# Patient Record
Sex: Female | Born: 1980
Health system: Southern US, Community
[De-identification: ages and names within clinical notes are randomized; demographics above are authoritative.]

## PROBLEM LIST (undated history)

## (undated) DIAGNOSIS — J309 Allergic rhinitis, unspecified: Secondary | ICD-10-CM

## (undated) DIAGNOSIS — O24419 Gestational diabetes mellitus in pregnancy, unspecified control: Secondary | ICD-10-CM

## (undated) DIAGNOSIS — D509 Iron deficiency anemia, unspecified: Secondary | ICD-10-CM

## (undated) DIAGNOSIS — T7840XA Allergy, unspecified, initial encounter: Secondary | ICD-10-CM

## (undated) DIAGNOSIS — F418 Other specified anxiety disorders: Secondary | ICD-10-CM

## (undated) DIAGNOSIS — Z803 Family history of malignant neoplasm of breast: Secondary | ICD-10-CM

## (undated) DIAGNOSIS — J45909 Unspecified asthma, uncomplicated: Secondary | ICD-10-CM

## (undated) DIAGNOSIS — Z800 Family history of malignant neoplasm of digestive organs: Secondary | ICD-10-CM

## (undated) HISTORY — DX: Unspecified asthma, uncomplicated: J45.909

## (undated) HISTORY — DX: Allergy, unspecified, initial encounter: T78.40XA

## (undated) HISTORY — DX: Other specified anxiety disorders: F41.8

## (undated) HISTORY — DX: Allergic rhinitis, unspecified: J30.9

## (undated) HISTORY — DX: Family history of malignant neoplasm of digestive organs: Z80.0

## (undated) HISTORY — DX: Iron deficiency anemia, unspecified: D50.9

## (undated) HISTORY — PX: TONSILLECTOMY: SUR1361

## (undated) HISTORY — DX: Family history of malignant neoplasm of breast: Z80.3

## (undated) HISTORY — DX: Gestational diabetes mellitus in pregnancy, unspecified control: O24.419

## (undated) HISTORY — PX: POLYPECTOMY: SHX149

---

## 2007-10-27 LAB — CONVERTED CEMR LAB

## 2008-01-06 ENCOUNTER — Ambulatory Visit: Payer: Self-pay | Admitting: Family Medicine

## 2008-01-06 DIAGNOSIS — J45909 Unspecified asthma, uncomplicated: Secondary | ICD-10-CM

## 2008-01-06 DIAGNOSIS — J309 Allergic rhinitis, unspecified: Secondary | ICD-10-CM | POA: Insufficient documentation

## 2008-01-28 ENCOUNTER — Ambulatory Visit: Payer: Self-pay | Admitting: Family Medicine

## 2008-02-04 LAB — CONVERTED CEMR LAB
ALT: 8 units/L (ref 0–35)
AST: 16 units/L (ref 0–37)
Alkaline Phosphatase: 36 units/L — ABNORMAL LOW (ref 39–117)
Bilirubin, Direct: 0.1 mg/dL (ref 0.0–0.3)
CO2: 27 meq/L (ref 19–32)
Glucose, Bld: 80 mg/dL (ref 70–99)
HDL: 61.7 mg/dL (ref >39.0)
Potassium: 4 meq/L (ref 3.5–5.1)
Sodium: 140 meq/L (ref 135–145)
Total Protein: 6.6 g/dL (ref 6.0–8.3)

## 2008-12-28 ENCOUNTER — Ambulatory Visit: Payer: Self-pay | Admitting: Family Medicine

## 2009-06-06 ENCOUNTER — Telehealth: Payer: Self-pay | Admitting: Family Medicine

## 2009-06-06 ENCOUNTER — Ambulatory Visit: Payer: Self-pay | Admitting: Family Medicine

## 2009-12-13 LAB — CONVERTED CEMR LAB
Pap Smear: NORMAL
Pap Smear: NORMAL

## 2010-01-11 ENCOUNTER — Ambulatory Visit: Payer: Self-pay | Admitting: Family Medicine

## 2010-01-11 ENCOUNTER — Encounter: Payer: Self-pay | Admitting: Family Medicine

## 2010-01-14 ENCOUNTER — Telehealth: Payer: Self-pay | Admitting: Family Medicine

## 2010-01-15 ENCOUNTER — Ambulatory Visit: Payer: Self-pay | Admitting: Family Medicine

## 2010-01-15 DIAGNOSIS — F418 Other specified anxiety disorders: Secondary | ICD-10-CM | POA: Insufficient documentation

## 2010-01-22 ENCOUNTER — Ambulatory Visit: Payer: Self-pay | Admitting: Psychology

## 2010-01-29 ENCOUNTER — Ambulatory Visit: Payer: Self-pay | Admitting: Psychology

## 2010-02-12 ENCOUNTER — Ambulatory Visit: Payer: Self-pay | Admitting: Psychology

## 2010-02-26 ENCOUNTER — Ambulatory Visit: Payer: Self-pay | Admitting: Psychology

## 2010-03-19 ENCOUNTER — Ambulatory Visit: Payer: Self-pay | Admitting: Psychology

## 2010-04-02 ENCOUNTER — Ambulatory Visit: Payer: Self-pay | Admitting: Psychology

## 2010-04-09 ENCOUNTER — Ambulatory Visit: Payer: Self-pay | Admitting: Psychology

## 2010-04-12 ENCOUNTER — Telehealth (INDEPENDENT_AMBULATORY_CARE_PROVIDER_SITE_OTHER): Payer: Self-pay | Admitting: *Deleted

## 2010-04-16 ENCOUNTER — Ambulatory Visit
Admission: RE | Admit: 2010-04-16 | Discharge: 2010-04-16 | Payer: Self-pay | Source: Home / Self Care | Attending: Family Medicine | Admitting: Family Medicine

## 2010-04-16 LAB — CONVERTED CEMR LAB
Albumin: 3.8 g/dL (ref 3.5–5.2)
CO2: 27 meq/L (ref 19–32)
Calcium: 9.2 mg/dL (ref 8.4–10.5)
Creatinine, Ser: 0.6 mg/dL (ref 0.4–1.2)
Glucose, Bld: 77 mg/dL (ref 70–99)
HDL: 71.8 mg/dL (ref >39.00)
Total Protein: 6.7 g/dL (ref 6.0–8.3)
Triglycerides: 47 mg/dL (ref 0.0–149.0)

## 2010-04-23 ENCOUNTER — Ambulatory Visit
Admission: RE | Admit: 2010-04-23 | Discharge: 2010-04-23 | Payer: Self-pay | Source: Home / Self Care | Attending: Family Medicine | Admitting: Family Medicine

## 2010-05-14 NOTE — Progress Notes (Signed)
----   Converted from flag ---- ---- 06/06/2009 4:01 PM, Lowella Petties CMA wrote: Called report from [facility]-  right leg negative for DVT, pt will go home. ------------------------------  [place] neg

## 2010-05-14 NOTE — Assessment & Plan Note (Signed)
Summary: 2:15  LEG CRAMPING X 2 DAYS/CLE   Vital Signs:  Patient profile:   30 year old female Height:      63 inches Weight:      127.13 pounds BMI:     22.60 Temp:     98.2 degrees F oral Pulse rate:   72 / minute Pulse rhythm:   regular BP sitting:   122 / 72  (left arm) Cuff size:   regular  Vitals Entered By: Linde Gillis CMA Duncan Dull) (June 06, 2009 2:08 PM) CC: cramp in right calf   History of Present Illness: 30 year old female:  2 days ago, cramping R calf. Pain in the mid portion of calf no inciting event  very active female on OCP relatively stationary at work  no FH of clotting disorder, DVT, PE  Allergies (verified): No Known Drug Allergies  Past History:  Past medical, surgical, family and social histories (including risk factors) reviewed, and no changes noted (except as noted below).  Past Medical History: Reviewed history from 01/06/2008 and no changes required. Current Problems:  ASTHMA, INTERMITTENT, MILD (ICD-493.90) ALLERGIC RHINITIS (ICD-477.9)    Past Surgical History: Reviewed history from 01/06/2008 and no changes required. Tonsillectomy  Family History: Reviewed history from 01/06/2008 and no changes required. father: healthy mother: breast cancer, age 6, no genetic testing done. sister: healthy MGM: colon cancer PGF: HTN, CAD  Social History: Reviewed history from 01/06/2008 and no changes required. 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 GI: No n/v/d Pulm: No SOB, cough, wheezing Interactive and getting along well at home.  Otherwise, ROS is as per the HPI.   Physical Exam  General:  Well-developed,well-nourished,in no acute distress; alert,appropriate and  cooperative throughout examination Head:  Normocephalic and atraumatic without obvious abnormalities. No apparent alopecia or balding. Ears:  no external deformities.   Nose:  no external deformity.   Lungs:  Normal respiratory effort, chest expands symmetrically. Lungs are clear to auscultation, no crackles or wheezes. Extremities:  no edema TTP along posterior of calf, mid-portion, tender with squeeze Neurologic:  alert & oriented X3 and gait normal.   Psych:  Cognition and judgment appear intact. Alert and cooperative with normal attention span and concentration. No apparent delusions, illusions, hallucinations   Impression & Recommendations:  Problem # 1:  CALF PAIN, RIGHT (ICD-729.5) Assessment New check u/s - DVT  if normal, expectant management and prob muscle  Orders: Radiology Referral (Radiology)  Complete Medication List: 1)  Trinessa (28) 0.035 Mg Tabs (Norgestimate-ethinyl estradiol) .... Take one by mouth daily as directed 2)  Proventil Hfa 108 (90 Base) Mcg/act Aers (Albuterol sulfate) .... 2 sprays per nostril q4 hours as needed wheeze 3)  Clonazepam 0.5 Mg Tbdp (Clonazepam) .... 1/2 to 1 tab by mouth every 6 hours as needed anxiety 4)  Epipen 0.3 Mg/0.29ml Devi (Epinephrine) .... Use as directed  Patient Instructions: 1)  Referral Appointment Information 2)  Day/Date: 3)  Time: 4)  Place/MD: 5)  Address: 6)  Phone/Fax: 7)  Patient given appointment information. Information/Orders faxed/mailed.   Current Allergies (reviewed today): No known allergies

## 2010-05-14 NOTE — Assessment & Plan Note (Signed)
Summary: ? ANXIETY   Vital Signs:  Patient profile:   30 year old female Height:      63 inches Weight:      120.0 pounds BMI:     21.33 Temp:     99.4 degrees F oral Pulse rate:   72 / minute Pulse rhythm:   regular BP sitting:   120 / 70  (left arm) Cuff size:   regular  Vitals Entered By: Benny Lennert CMA Duncan Dull) (January 15, 2010 1:57 PM)  History of Present Illness: Chief complaint Anxiety  In last year..she has been increasingly stress. In past few days..she feels like she cannot control it anymore. A lot of home problems..she is scared about the furture...cannot see the future. Anxious all day. "cannot put on happy face any more" Cannot sleep at night. No SI..but does think if she wasn't here she wouldn't have problems. Some anhedonia.Marland Kitchendoes not have time for fun thing. Has 2 jobs currently. Husband is her main and only support..she does not want to tell friends and family.  Mother died two years ago..money problems.Taken on extra work, legal issues in past.  Fatigued. Some clouded thinking.   No  personal history of depression, anxiety. Family history of agoraphobia   Problems Prior to Update: 1)  Calf Pain, Right  (ICD-729.5) 2)  Anxiety, Situational  (ICD-308.3) 3)  Seborrheic Keratosis  (ICD-702.19) 4)  Asthma, Intermittent, Mild  (ICD-493.90) 5)  Allergic Rhinitis  (ICD-477.9)  Current Medications (verified): 1)  Trinessa (28) 0.035 Mg Tabs (Norgestimate-Ethinyl Estradiol) .... Take One By Mouth Daily As Directed 2)  Proventil Hfa 108 (90 Base) Mcg/act Aers (Albuterol Sulfate) .... 2 Sprays Per Nostril Q4 Hours As Needed Wheeze 3)  Clonazepam 0.5 Mg Tbdp (Clonazepam) .... 1/2 To 1 Tab By Mouth Every 6 Hours As Needed Anxiety 4)  Epipen 0.3 Mg/0.47ml Devi (Epinephrine) .... Use As Directed 5)  Fluticasone Propionate 50 Mcg/act Susp (Fluticasone Propionate) .... 2 Sprays Per Nostril Daily  Allergies (verified): No Known Drug Allergies  Past  History:  Past medical, surgical, family and social histories (including risk factors) reviewed, and no changes noted (except as noted below).  Past Medical History: Reviewed history from 01/06/2008 and no changes required. Current Problems:  ASTHMA, INTERMITTENT, MILD (ICD-493.90) ALLERGIC RHINITIS (ICD-477.9)    Past Surgical History: Reviewed history from 01/06/2008 and no changes required. Tonsillectomy  Family History: Reviewed history from 01/06/2008 and no changes required. father: healthy mother: breast cancer, age 67, no genetic testing done. sister: healthy MGM: colon cancer PGF: HTN, CAD  Social History: Reviewed history from 01/06/2008 and no changes required. 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 CV:  Denies chest pain or discomfort. Resp:  Denies shortness of breath. GI:  Denies abdominal pain. GU:  Denies dysuria.  Physical Exam  General:  tearful, anxious, breathing quickly..calms down through appt to more relaxed state. Mouth:  MMM Neck:  no carotid bruit or thyromegaly no cervical or supraclavicular lymphadenopathy  Lungs:  Normal respiratory effort, chest expands symmetrically. Lungs are clear to auscultation, no crackles or wheezes. Heart:  Normal rate and regular rhythm. S1 and S2 normal without gallop, murmur, click, rub or other extra sounds. Psych:  Oriented X3, memory intact for recent and remote, good eye contact, subdued, withdrawn, poor eye contact, tearful, moderately anxious, and poor  concentration.   occ laughs inappropriately/hysterically   Impression & Recommendations:  Problem # 1:  DEPRESSION/ANXIETY (ICD-300.4) Multiple life stressors..she is coping poorly..has pushed down mood and anxiety for longtime..now "bursting at seams" Refer  to pshycologist.  Start on seratonin 50 mg daily .Marland Kitchenuse clonazepam temporarily until SSRI begins working. Counsled on med use, SE.  Gave info on [facility] Help line.  Total visit time > 50% spent counseling and cordinating patients care  Orders: Psychology Referral (Psychology)  Complete Medication List: 1)  Trinessa (28) 0.035 Mg Tabs (Norgestimate-ethinyl estradiol) .... Take one by mouth daily as directed 2)  Proventil Hfa 108 (90 Base) Mcg/act Aers (Albuterol sulfate) .... 2 sprays per nostril q4 hours as needed wheeze 3)  Epipen 0.3 Mg/0.79ml Devi (Epinephrine) .... Use as directed 4)  Fluticasone Propionate 50 Mcg/act Susp (Fluticasone propionate) .... 2 sprays per nostril daily 5)  Sertraline Hcl 50 Mg Tabs (Sertraline hcl) .Marland Kitchen.. 1 tab by mouth at bedtime 6)  Clonazepam 0.5 Mg Tabs (Clonazepam) .... 1/2 to 1 tab by mouth daily as needed anxiety  Patient Instructions: 1)  Referral Appointment Information 2)  Day/Date: 3)  Time: 4)  Place/MD: 5)  Address: 6)  Phone/Fax: 7)  Patient given appointment information. Information/Orders faxed/mailed.  8)  Start sertraline at bedtime. 9)  Use clonazepam intermittantly for anxiety. 10)  Call if any SE to medicaiton.  11)  Please schedule a follow-up appointment in 1 month.  Prescriptions: CLONAZEPAM 0.5 MG TABS (CLONAZEPAM) 1/2 to 1 tab by mouth daily as needed anxiety  #20 x 0   Entered and Authorized by:   Kerby Nora MD   Signed by:   Kerby Nora MD on 01/15/2010   Method used:   Print then Give to Patient   RxID:   [id number] SERTRALINE HCL 50 MG TABS (SERTRALINE HCL) 1 tab by mouth at bedtime  #30 x 5   Entered and Authorized by:   Kerby Nora MD   Signed by:   Kerby Nora MD on 01/15/2010   Method used:   [facility]        [facility]  [address] [facility]retail[facility]       [address]       [place], [place]  [address]       Ph: [phone] or [phone]       Fax: [phone]   RxID:    [id number]   Current Allergies (reviewed today): No known allergies

## 2010-05-14 NOTE — Progress Notes (Signed)
Summary: call a nurse   Phone Note Call from Patient   Summary of Call: Triage Record Num: [id number] Operator: Hillary Bow Patient Name: Kayla Solis Call Date & Time: 01/13/2010 12:26:27PM Patient Phone: [phone] PCP: Kerby Nora Patient Gender: Female PCP Fax : [phone] Patient DOB: 03-20-1981 Practice Name[facility] Reason for Call: Spouse/Paul calling about Pt having severe anxiety d/t stress at home. Pt is crying all day per husband, can't function as normal. Pt had Clonazepam PRN for anxiety is currently out and would like to have refilled so Pt can work on 01-14-10. All emergent sxs r/o per Anxiety Protocol. Clonazepam 0.5mg  PO 1/2 tablet daily PRN Anxiety, 5 tablets,no refills, called into [facility], [place], [phone], until Pt can f/u w/ office on 01-14-10 for appt. Husband verbalized understanding. Home advice given. Protocol(s) Used: Anxiety: Panic Recommended Outcome per Protocol: Call Charleigh Correnti within 24 Hours Reason for Outcome: History of psychiatric or emotional disorder or hospitalization for same Care Advice:  ~ IMMEDIATE ACTION  ~ CAUTIONS 10/02/ Initial call taken by: Melody Comas,  January 14, 2010 8:39 AM  Follow-up for Phone Call        please call patient to check on her and set up f/u with AEB Hannah Beat MD  January 14, 2010 5:25 PM   Additional Follow-up for Phone Call Additional follow up Details #1::        Patient has appt today.Consuello Masse CMA   Additional Follow-up by: Benny Lennert CMA Duncan Dull),  January 15, 2010 8:12 AM

## 2010-05-14 NOTE — Assessment & Plan Note (Signed)
Summary: REFILL MEDS/CLE   Vital Signs:  Patient profile:   30 year old female Height:      63 inches Weight:      122.0 pounds BMI:     21.69 Temp:     99.3 degrees F oral Pulse rate:   72 / minute Pulse rhythm:   regular BP sitting:   120 / 70  (left arm) Cuff size:   regular  Vitals Entered By: Benny Lennert CMA Duncan Dull) (January 11, 2010 3:53 PM)  History of Present Illness: Chief complaint Refill medication  Sees GYN for yearly CPX.   Asthma symptoms are much worse during allergy season as in fall currently.  Post nasal drip, congestion...uses saline spray.  Not on any allergy med...has tried Careers adviser, zyrtec, claritin D. Tried singulair, advair..no relief. Has allergy shots.  Asthma History    Initial Asthma Severity Rating:    Age range: 12+ years    Symptoms: >2 days/week; not daily during fall allergiy season    Nighttime Awakenings: >1/week but not nightly    Interferes w/ normal activity: no limitations    SABA use (not for EIB): 0-2 days/week    Asthma Severity Assessment: Moderate Persistent   Problems Prior to Update: 1)  Calf Pain, Right  (ICD-729.5) 2)  Anxiety, Situational  (ICD-308.3) 3)  Seborrheic Keratosis  (ICD-702.19) 4)  Asthma, Intermittent, Mild  (ICD-493.90) 5)  Allergic Rhinitis  (ICD-477.9)  Current Medications (verified): 1)  Trinessa (28) 0.035 Mg Tabs (Norgestimate-Ethinyl Estradiol) .... Take One By Mouth Daily As Directed 2)  Proventil Hfa 108 (90 Base) Mcg/act Aers (Albuterol Sulfate) .... 2 Sprays Per Nostril Q4 Hours As Needed Wheeze 3)  Clonazepam 0.5 Mg Tbdp (Clonazepam) .... 1/2 To 1 Tab By Mouth Every 6 Hours As Needed Anxiety 4)  Epipen 0.3 Mg/0.78ml Devi (Epinephrine) .... Use As Directed  Allergies (verified): No Known Drug Allergies  Past History:  Past medical, surgical, family and social histories (including risk factors) reviewed, and no changes noted (except as noted below).  Past Medical History: Reviewed  history from 01/06/2008 and no changes required. Current Problems:  ASTHMA, INTERMITTENT, MILD (ICD-493.90) ALLERGIC RHINITIS (ICD-477.9)    Past Surgical History: Reviewed history from 01/06/2008 and no changes required. Tonsillectomy  Family History: Reviewed history from 01/06/2008 and no changes required. father: healthy mother: breast cancer, age 49, no genetic testing done. sister: healthy MGM: colon cancer PGF: HTN, CAD  Social History: Reviewed history from 01/06/2008 and no changes required. 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 CV:  Denies chest pain or discomfort. Resp:  Denies shortness of breath. GI:  Denies abdominal pain. GU:  Denies dysuria.  Physical Exam  General:  Well-developed,well-nourished,in no acute distress; alert,appropriate and cooperative throughout examination Eyes:  No corneal or conjunctival inflammation noted. EOMI. Perrla. Funduscopic exam benign, without hemorrhages, exudates or papilledema. Vision grossly normal. Ears:  External ear exam shows no significant lesions or deformities.  Otoscopic examination reveals clear canals, tympanic membranes are intact bilaterally without bulging, retraction, inflammation or discharge. Hearing is grossly normal bilaterally. Nose:  no external deformity.  nasal dischargemucosal pallor.   Mouth:  Oral mucosa and oropharynx without lesions or exudates.  Teeth in good repair. Neck:  no carotid bruit or thyromegaly no cervical or supraclavicular lymphadenopathy  Lungs:  Normal respiratory effort, chest expands  symmetrically. Lungs are clear to auscultation, no crackles or wheezes. Heart:  Normal rate and regular rhythm. S1 and S2 normal without gallop, murmur, click, rub or other extra sounds. Abdomen:  Bowel  sounds positive,abdomen soft and non-tender without masses, organomegaly or hernias noted. Pulses:  R and L posterior tibial pulses are full and equal bilaterally  Extremities:  no edema    Impression & Recommendations:  Problem # 1:  ASTHMA, INTERMITTENT, MILD (ICD-493.90) Spirometry in nml range.  no additional meds for asthma indicated. Continue to use albuterol sparingly.  Her updated medication list for this problem includes:    Proventil Hfa 108 (90 Base) Mcg/act Aers (Albuterol sulfate) .Marland Kitchen... 2 sprays per nostril q4 hours as needed wheeze  Problem # 2:  ALLERGIC RHINITIS (ICD-477.9) Needs improved control.Marland Kitchenadd fluticasone nasal spray. Oral antihistminaines have not been helpful in past.  Her updated medication list for this problem includes:    Fluticasone Propionate 50 Mcg/act Susp (Fluticasone propionate) .Marland Kitchen... 2 sprays per nostril daily  Complete Medication List: 1)  Trinessa (28) 0.035 Mg Tabs (Norgestimate-ethinyl estradiol) .... Take one by mouth daily as directed 2)  Proventil Hfa 108 (90 Base) Mcg/act Aers (Albuterol sulfate) .... 2 sprays per nostril q4 hours as needed wheeze 3)  Epipen 0.3 Mg/0.59ml Devi (Epinephrine) .... Use as directed 4)  Fluticasone Propionate 50 Mcg/act Susp (Fluticasone propionate) .... 2 sprays per nostril daily  Patient Instructions: 1)   Start nasal fluticasone 2 sprays per nostril daily.  2)  Call if allergy symptoms worsening.  3)  Please schedule a follow-up appointment in 1 year.  Prescriptions: FLUTICASONE PROPIONATE 50 MCG/ACT SUSP (FLUTICASONE PROPIONATE) 2 sprays per nostril daily  #1 x 0   Entered and Authorized by:   Kerby Nora MD   Signed by:   Kerby Nora MD on 01/11/2010   Method used:   [facility] [facility]       [address]       [place], [place]  [address]       Ph: [phone] or [phone]       Fax: [phone]   RxID:   [id number] EPIPEN 0.3 MG/0.3ML DEVI  (EPINEPHRINE) Use as directed  #1 x 2   Entered and Authorized by:   Kerby Nora MD   Signed by:   Kerby Nora MD on 01/11/2010   Method used:   [facility]        [id number]       [address]       [place], [place]  [address]       Ph: [phone] or [phone]       Fax: [phone]   RxID:   [id number] PROVENTIL HFA 108 (90 BASE) MCG/ACT AERS (ALBUTEROL SULFATE) 2 sprays per nostril q4 hours as needed wheeze  #1 x 3   Entered and Authorized by:   Kerby Nora MD   Signed by:   Kerby Nora MD on 01/11/2010   Method used:   [facility]        [facility] [facility]       [address]       [place], [place]  [address]       Ph: [phone] or [phone]       Fax: [phone]   RxID:   [id number]   Current Allergies (reviewed today): No known allergies   Last Flu Vaccine:  Fluvax  3+ (01/06/2008 1:42:38 PM) Flu Vaccine Next Due:  Refused Last PAP:  normal (11/12/2008 8:34:56 AM) PAP Result Date:  12/13/2009 PAP Result:  normal PAP Next Due:  1 yr

## 2010-05-16 NOTE — Progress Notes (Signed)
----   Converted from flag ---- ---- 04/11/2010 10:21 PM, Kerby Nora MD wrote: CMET, lipids Dx v77.91 and STD panel if pt interested  ---- 04/10/2010 9:57 AM, Liane Comber CMA (AAMA) wrote: Lab orders please! Good Morning! This pt is scheduled for cpx labs Monday, which labs to draw and dx codes to use? Thanks Tasha ------------------------------

## 2010-05-16 NOTE — Assessment & Plan Note (Signed)
Summary: CPX/CLE   Vital Signs:  Patient profile:   30 year old female Height:      63 inches Weight:      127 pounds BMI:     22.58 Temp:     98.8 degrees F oral Pulse rate:   72 / minute Pulse rhythm:   regular BP sitting:   110 / 70  (left arm) Cuff size:   regular  Vitals Entered By: Benny Lennert CMA Duncan Dull) (April 23, 2010 2:44 PM)  History of Present Illness: Chief complaint follow up  Sees GYN for yearly pelvic and pap smear.   Depression.. improved control... since last OV. Seeing Dr. Laymond Purser pschycologist weekly.  On sertraline 50 mg daily. No SI, no HI.   Interested in getting pregnant. On folic acid.   Asthma, mild intermittant.. no problems in past few years.  Allergies well controlled in last year.   Reviewed labs in detail.   Problems Prior to Update: 1)  Screening For Lipoid Disorders  (ICD-V77.91) 2)  Depression/anxiety  (ICD-300.4) 3)  Asthma, Intermittent, Mild  (ICD-493.90) 4)  Allergic Rhinitis  (ICD-477.9)  Current Medications (verified): 1)  Proventil Hfa 108 (90 Base) Mcg/act Aers (Albuterol Sulfate) .... 2 Sprays Per Nostril Q4 Hours As Needed Wheeze 2)  Epipen 0.3 Mg/0.1ml Devi (Epinephrine) .... Use As Directed 3)  Fluticasone Propionate 50 Mcg/act Susp (Fluticasone Propionate) .... 2 Sprays Per Nostril Daily 4)  Sertraline Hcl 50 Mg Tabs (Sertraline Hcl) .Marland Kitchen.. 1 Tab By Mouth At Bedtime 5)  Clonazepam 0.5 Mg Tabs (Clonazepam) .... 1/2 To 1 Tab By Mouth Daily As Needed Anxiety  Allergies (verified): No Known Drug Allergies  Past History:  Past medical, surgical, family and social histories (including risk factors) reviewed, and no changes noted (except as noted below).  Past Medical History: Reviewed history from 01/06/2008 and no changes required. Current Problems:  ASTHMA, INTERMITTENT, MILD (ICD-493.90) ALLERGIC RHINITIS (ICD-477.9)    Past Surgical History: Reviewed history from 01/06/2008 and no changes  required. Tonsillectomy  Family History: Reviewed history from 01/06/2008 and no changes required. father: healthy mother: breast cancer, age 72, no genetic testing done. sister: healthy MGM: colon cancer PGF: HTN, CAD  Social History: Reviewed history from 01/06/2008 and no changes required. 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 CV:  Denies chest pain or discomfort. Resp:  Denies shortness of breath. GI:  Denies abdominal pain and bloody stools. GU:  Denies abnormal vaginal bleeding and dysuria.  Physical Exam  General:  Well-developed,well-nourished,in no acute distress; alert,appropriate and cooperative throughout examination Eyes:  No corneal or conjunctival inflammation noted. EOMI. Perrla. Funduscopic exam benign, without hemorrhages, exudates or papilledema. Vision grossly normal. Ears:  External ear exam shows no significant lesions or deformities.  Otoscopic examination reveals clear canals, tympanic membranes are intact bilaterally without bulging, retraction, inflammation or discharge. Hearing is grossly normal bilaterally. Nose:  External nasal examination shows no deformity or inflammation. Nasal mucosa are pink and moist without lesions or exudates. Mouth:  Oral mucosa and oropharynx without lesions or exudates.  Teeth in good repair. Neck:  no carotid bruit or thyromegaly no cervical or supraclavicular lymphadenopathy  Lungs:  Normal respiratory effort, chest expands symmetrically. Lungs are clear to auscultation, no crackles or wheezes. Heart:  Normal rate and regular rhythm. S1 and S2 normal without gallop, murmur,  click, rub or other extra sounds. Abdomen:  Bowel sounds positive,abdomen soft and non-tender without masses, organomegaly or hernias noted. Genitalia:   per GYN  Msk:  No deformity or scoliosis noted of thoracic or lumbar spine.   Pulses:  R and L posterior tibial pulses are full and equal bilaterally  Extremities:  no edema Skin:  Intact without suspicious lesions or rashes Psych:  Cognition and judgment appear intact. Alert and cooperative with normal attention span and concentration. No apparent delusions, illusions, hallucinations   Impression & Recommendations:  Problem # 1:  DEPRESSION/ANXIETY (ICD-300.4) Assessment Improved Wean off sertraline as directed. Continue counsleing.  Problem # 2:  ALLERGIC RHINITIS (ICD-477.9) Assessment: Improved Well controlled off medicaiton.  The following medications were removed from the medication list:    Fluticasone Propionate 50 Mcg/act Susp (Fluticasone propionate) .Marland Kitchen... 2 sprays per nostril daily  Problem # 3:  ASTHMA, INTERMITTENT, MILD (ICD-493.90) Assessment: Unchanged Minimal problem.. no need for controller med.  Her updated medication list for this problem includes:    Proventil Hfa 108 (90 Base) Mcg/act Aers (Albuterol sulfate) .Marland Kitchen... 2 sprays per nostril q4 hours as needed wheeze  Complete Medication List: 1)  Proventil Hfa 108 (90 Base) Mcg/act Aers (Albuterol sulfate) .... 2 sprays per nostril q4 hours as needed wheeze 2)  Epipen 0.3 Mg/0.37ml Devi (Epinephrine) .... Use as directed 3)  Sertraline Hcl 50 Mg Tabs (Sertraline hcl) .... 1/2 tab by mouth at bedtime, wean.  Patient Instructions: 1)  Start by weaning off sertraline...go down to 25 mg daily x 2 weeks, tehn every other day for 1 week then every 2 days x 1 week then off.  2)  Continue counsleing.  3)  Please schedule a follow-up appointment in 1 year.    Orders Added: 1)  Est. Patient Level IV [28413]    Current Allergies (reviewed today): No known allergies   Last Flu Vaccine:  Fluvax 3+ (01/06/2008 1:42:38 PM) Flu Vaccine Next Due:  Refused

## 2010-06-26 ENCOUNTER — Other Ambulatory Visit (HOSPITAL_COMMUNITY): Payer: Self-pay | Admitting: Obstetrics and Gynecology

## 2010-06-26 DIAGNOSIS — O3680X0 Pregnancy with inconclusive fetal viability, not applicable or unspecified: Secondary | ICD-10-CM

## 2010-06-27 ENCOUNTER — Encounter (HOSPITAL_COMMUNITY): Payer: Self-pay

## 2010-06-27 ENCOUNTER — Ambulatory Visit (HOSPITAL_COMMUNITY)
Admission: RE | Admit: 2010-06-27 | Discharge: 2010-06-27 | Disposition: A | Discharge status: Home / Self Care | Payer: 59 | Source: Ambulatory Visit | Attending: Obstetrics and Gynecology | Admitting: Obstetrics and Gynecology

## 2010-06-27 ENCOUNTER — Other Ambulatory Visit (HOSPITAL_COMMUNITY): Payer: Self-pay | Admitting: Obstetrics and Gynecology

## 2010-06-27 DIAGNOSIS — Z3689 Encounter for other specified antenatal screening: Secondary | ICD-10-CM | POA: Insufficient documentation

## 2010-06-27 DIAGNOSIS — O3680X0 Pregnancy with inconclusive fetal viability, not applicable or unspecified: Secondary | ICD-10-CM

## 2010-06-27 DIAGNOSIS — O368390 Maternal care for abnormalities of the fetal heart rate or rhythm, unspecified trimester, not applicable or unspecified: Secondary | ICD-10-CM | POA: Insufficient documentation

## 2010-07-04 ENCOUNTER — Ambulatory Visit (HOSPITAL_COMMUNITY)
Admission: RE | Admit: 2010-07-04 | Discharge: 2010-07-04 | Disposition: A | Discharge status: Home / Self Care | Payer: 59 | Source: Ambulatory Visit | Attending: Obstetrics and Gynecology | Admitting: Obstetrics and Gynecology

## 2010-07-04 DIAGNOSIS — O368390 Maternal care for abnormalities of the fetal heart rate or rhythm, unspecified trimester, not applicable or unspecified: Secondary | ICD-10-CM | POA: Insufficient documentation

## 2010-07-04 DIAGNOSIS — Z3689 Encounter for other specified antenatal screening: Secondary | ICD-10-CM | POA: Insufficient documentation

## 2010-07-04 DIAGNOSIS — O3680X0 Pregnancy with inconclusive fetal viability, not applicable or unspecified: Secondary | ICD-10-CM

## 2010-07-09 ENCOUNTER — Ambulatory Visit (HOSPITAL_COMMUNITY)
Admission: RE | Admit: 2010-07-09 | Discharge: 2010-07-09 | Disposition: A | Discharge status: Home / Self Care | Payer: 59 | Source: Ambulatory Visit | Attending: Obstetrics and Gynecology | Admitting: Obstetrics and Gynecology

## 2010-07-09 ENCOUNTER — Other Ambulatory Visit: Payer: Self-pay | Admitting: Obstetrics and Gynecology

## 2010-07-09 DIAGNOSIS — O021 Missed abortion: Secondary | ICD-10-CM | POA: Insufficient documentation

## 2010-07-09 LAB — CBC
HCT: 38.5 % (ref 36.0–46.0)
MCH: 25.5 pg — ABNORMAL LOW (ref 26.0–34.0)
MCV: 80.5 fL (ref 78.0–100.0)
Platelets: 225 10*3/uL (ref 150–400)
RBC: 4.78 MIL/uL (ref 3.87–5.11)

## 2010-07-09 LAB — DIFFERENTIAL
Eosinophils Absolute: 0.2 10*3/uL (ref 0.0–0.7)
Eosinophils Relative: 3 % (ref 0–5)
Lymphs Abs: 2 10*3/uL (ref 0.7–4.0)
Monocytes Relative: 14 % — ABNORMAL HIGH (ref 3–12)
Neutrophils Relative %: 57 % (ref 43–77)

## 2010-07-14 DEATH — deceased

## 2010-07-18 NOTE — Op Note (Signed)
  Kayla Solis, Kayla Solis                 ACCOUNT NO.:  [id number]  MEDICAL RECORD NO.:  [id number]           PATIENT TYPE:  O  LOCATION:  [facility]                          [facility]  PHYSICIAN:  Malachi Pro. Ambrose Mantle, M.D. DATE OF BIRTH:  17-May-1980  DATE OF PROCEDURE:  07/09/2010 DATE OF DISCHARGE:                              OPERATIVE REPORT   PREOPERATIVE DIAGNOSIS:  Nonviable intrauterine pregnancy.  Fetal pole was never truly seen well.  POSTOPERATIVE DIAGNOSIS:  Nonviable intrauterine pregnancy.  Fetal pole was never truly seen well.  OPERATIONS:  Suction dilation and curettage.  OPERATOR:  Malachi Pro. Ambrose Mantle, MD  ANESTHESIA:  MAC anesthesia.  The patient was brought to the operating room and placed under MAC anesthesia.  The vulva, vagina, and perineum were prepped with Betadine solution.  The urethra was prepped.  The bladder was emptied with a [place] catheter.  The area was draped as a sterile field.  A weighted speculum was placed posteriorly.  Cervix was grasped on its anterior lip with a tenaculum.  The uterus was difficult to feel.  It was thought to be about 6 weeks size.  The adnexa were free of masses.  Cervix was drawn into the operative field, sounded to 10 cm, slightly anteriorly. It was dilated up so that it would admit a #7 curved suction curette. Initially, I did not get any tissue.  I had to use the polyp forceps to see if there was tissue and I did find some old appearing yellowish tissue, so I was confident that the patient had not passed the tissue and then further suctions revealed a significant amount of tissue that was compatible with the early pregnancy.  A sharp D and C was done to see if the walls felt smooth, they felt smooth.  I did a final suction. There was no more tissue present and the procedure was terminated. There was no bleeding from the tenaculum site.  The patient was returned to recovery in satisfactory condition.  Blood loss was thought to  be no more than 25 mL.     Malachi Pro. Ambrose Mantle, M.D.     TFH/MEDQ  D:  07/09/2010  T:  08-07-10  Job:  [id number]  Electronically Signed by Tracey Harries M.D. on 07/18/2010 08:52:26 AM

## 2010-09-15 IMAGING — US US EXTREM LOW VENOUS*R*
1 series · 17 of 24 positions shown · non-contrast
Comparison: none

REASON FOR EXAM: call report  [id number]  right leg swelling  calf pain
eval for DVT
COMMENTS:

[Series 1: us extrem low venous*right* · 17 of 25 slices shown]
[im 1/25]
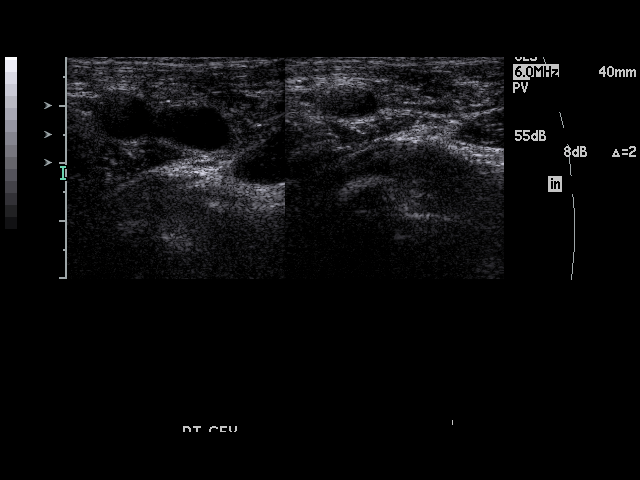
[im 3/25]
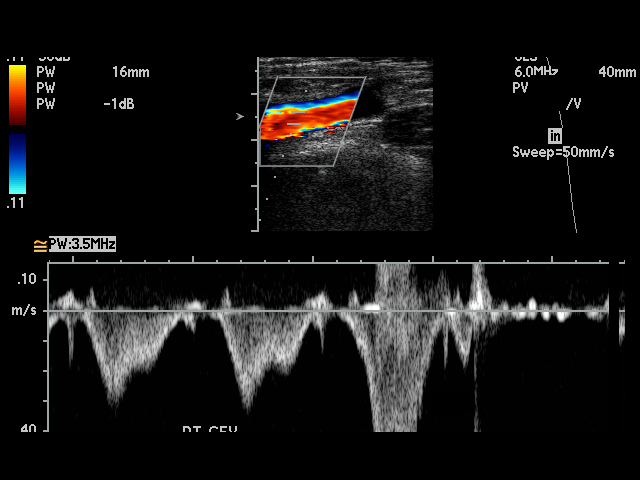
[im 4/25]
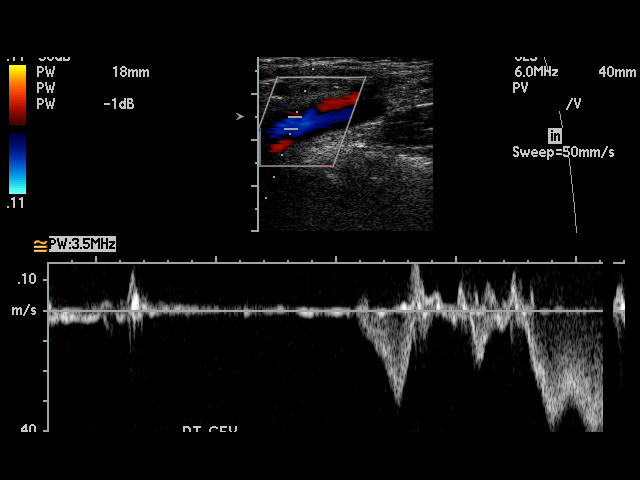
[im 5/25]
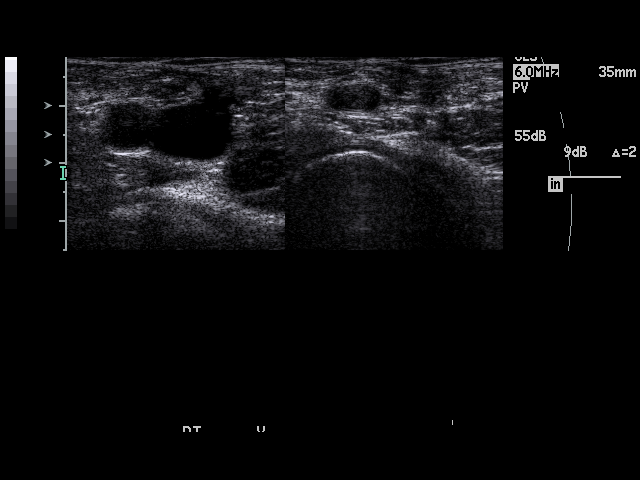
[im 7/25]
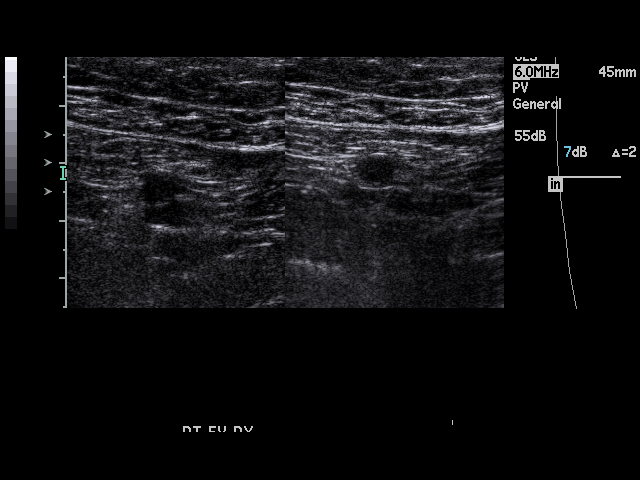
[im 8/25]
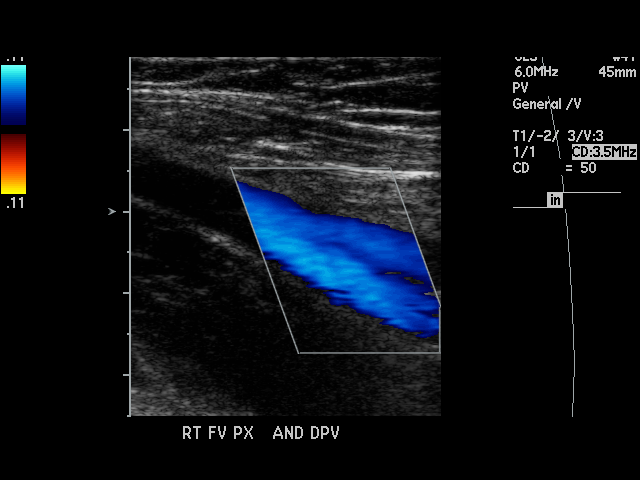
[im 10/25]
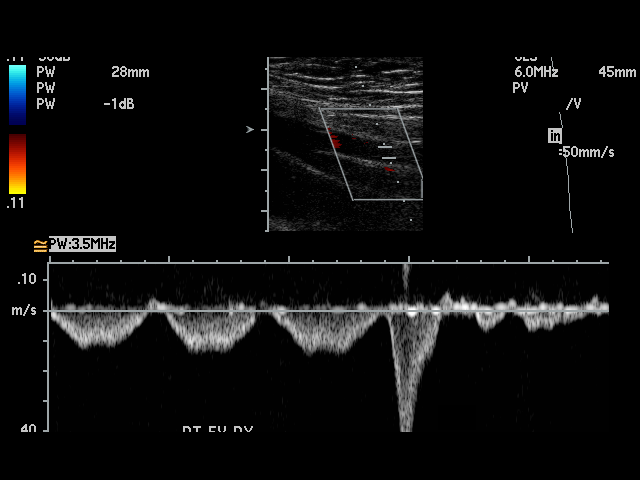
[im 11/25]
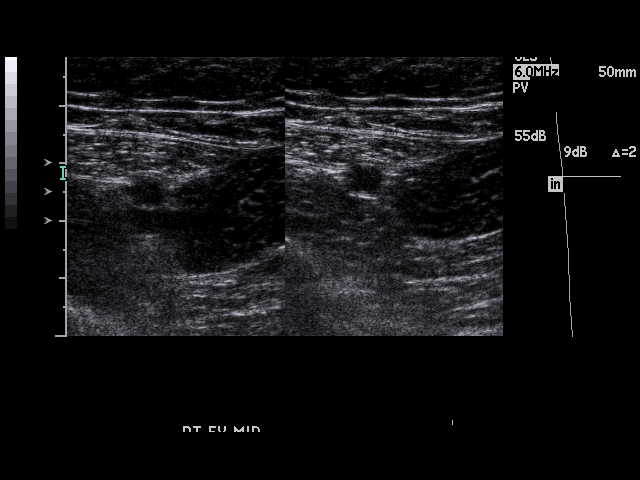
[im 13/25]
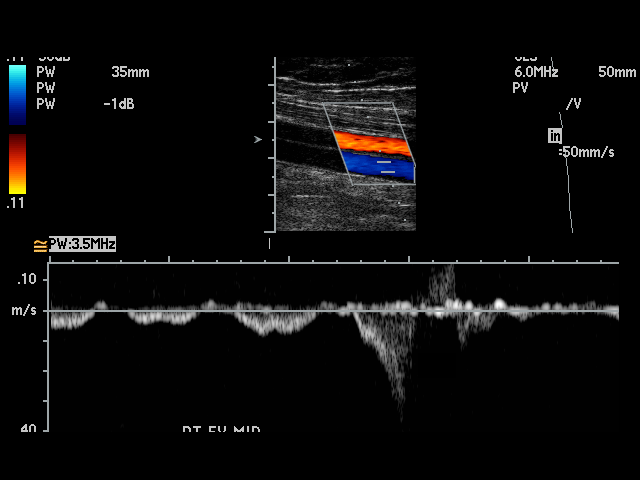
[im 14/25]
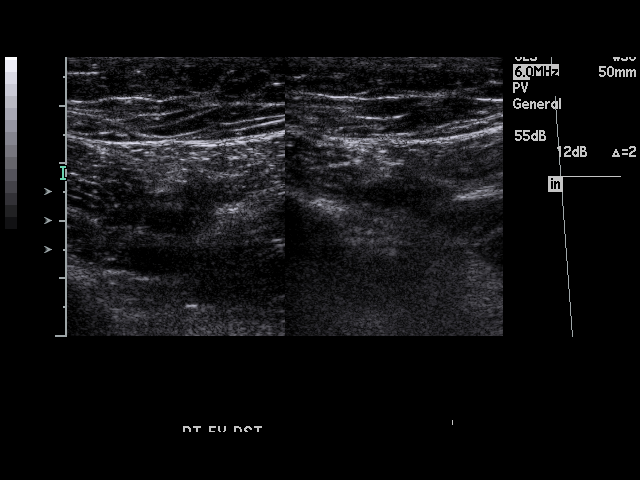
[im 15/25]
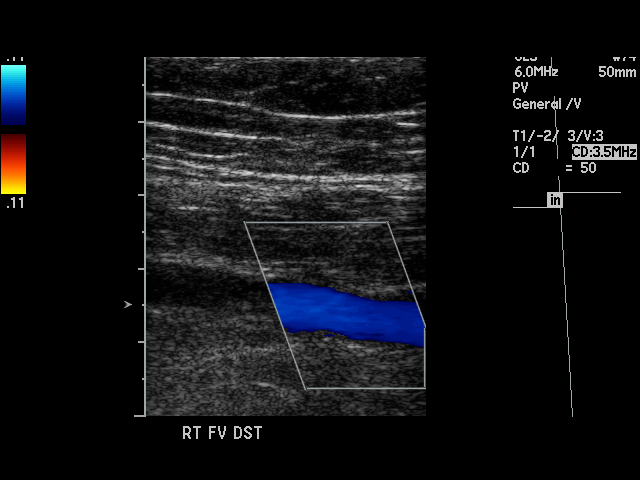
[im 17/25]
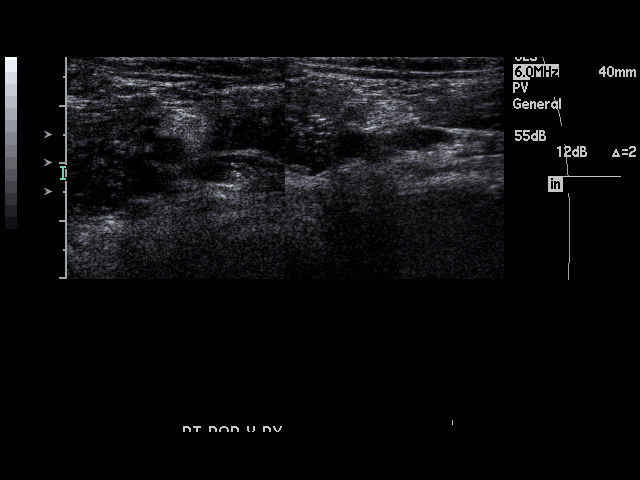
[im 18/25]
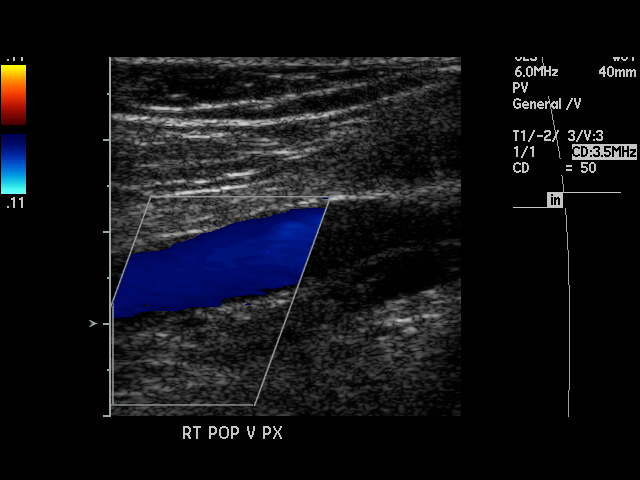
[im 20/25]
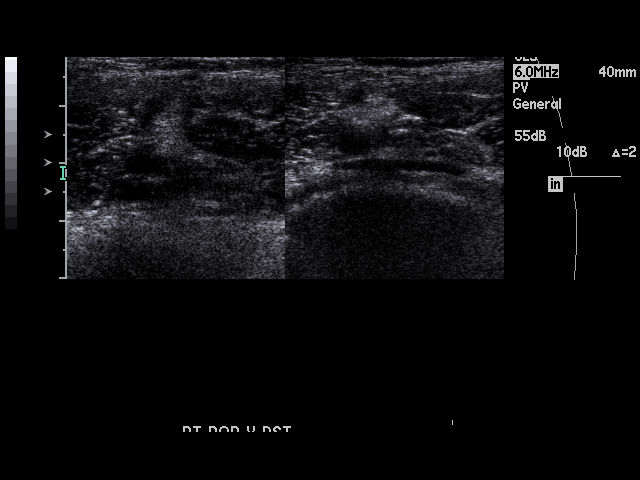
[im 21/25]
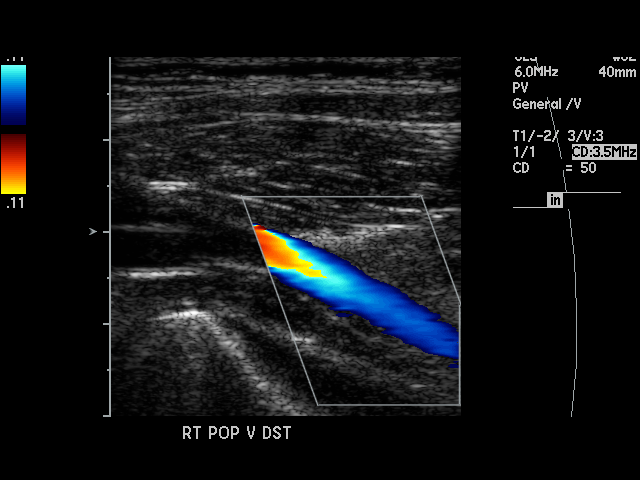
[im 22/25]
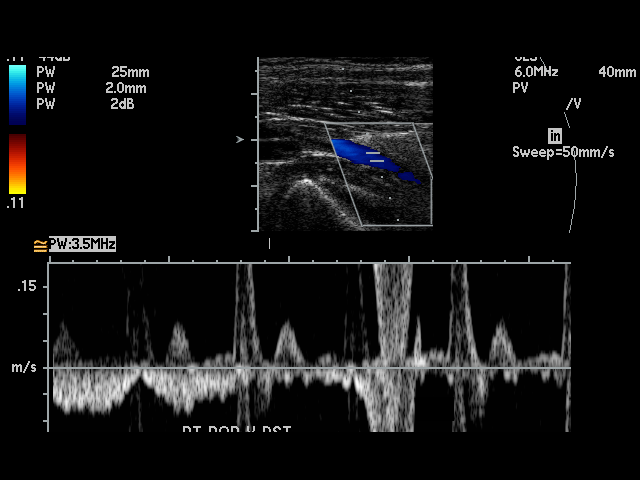
[im 25/25]
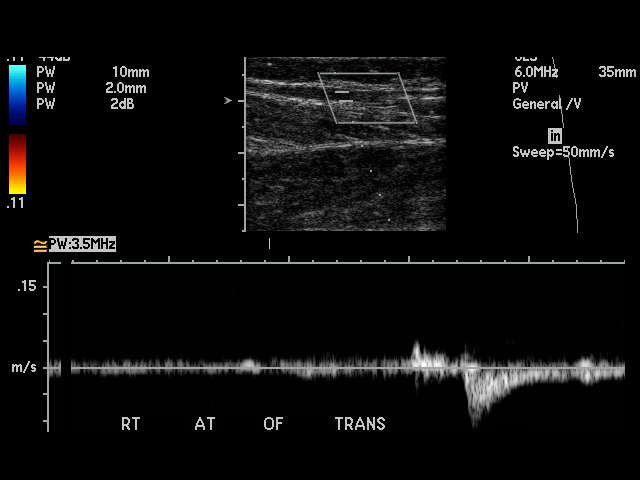

[17 of 24 positions shown; findings below may reference images not displayed]

PROCEDURE:     US  - US DOPPLER LOW EXTR RIGHT  - June 06, 2009  [DATE]

RESULT:     The augmentation and phasic flow waveforms are normal in
appearance. The femoral and popliteal vein shows complete compressibility
throughout its course. Doppler examination shows no occlusion or evidence of
deep vein thrombosis.
IMPRESSION: No deep vein thrombosis is identified.

## 2011-04-15 NOTE — L&D Delivery Note (Signed)
Delivery Note Pt progressed to complete and pushed well.  At 12:49 PM a viable female was delivered via Vaginal, Spontaneous Delivery (Presentation: Left Occiput Anterior).  APGAR: 8, 9; weight pending.   Placenta status: Intact, Spontaneous.  Cord: 3 vessels with the following complications: None.  Anesthesia: Local  Episiotomy: None Lacerations: Irregular 2nd degree;Perineal Suture Repair: 3.0 vicryl and Vicryl rapide Est. Blood Loss (mL): 400  Mom to postpartum.  Baby to stay with mom.  Kalyssa Anker D 01/02/2012, 1:29 PM

## 2011-05-15 ENCOUNTER — Other Ambulatory Visit (HOSPITAL_COMMUNITY): Payer: Self-pay | Admitting: Obstetrics and Gynecology

## 2011-05-15 DIAGNOSIS — O3680X0 Pregnancy with inconclusive fetal viability, not applicable or unspecified: Secondary | ICD-10-CM

## 2011-05-16 ENCOUNTER — Ambulatory Visit (HOSPITAL_COMMUNITY)
Admission: RE | Admit: 2011-05-16 | Discharge: 2011-05-16 | Disposition: A | Discharge status: Home / Self Care | Payer: PRIVATE HEALTH INSURANCE | Source: Ambulatory Visit | Attending: Obstetrics and Gynecology | Admitting: Obstetrics and Gynecology

## 2011-05-16 DIAGNOSIS — O209 Hemorrhage in early pregnancy, unspecified: Secondary | ICD-10-CM | POA: Insufficient documentation

## 2011-05-16 DIAGNOSIS — O3680X0 Pregnancy with inconclusive fetal viability, not applicable or unspecified: Secondary | ICD-10-CM

## 2011-05-16 DIAGNOSIS — Z3689 Encounter for other specified antenatal screening: Secondary | ICD-10-CM | POA: Insufficient documentation

## 2011-05-19 ENCOUNTER — Other Ambulatory Visit (HOSPITAL_COMMUNITY): Payer: Self-pay | Admitting: Obstetrics and Gynecology

## 2011-05-19 DIAGNOSIS — O3680X0 Pregnancy with inconclusive fetal viability, not applicable or unspecified: Secondary | ICD-10-CM

## 2011-05-23 ENCOUNTER — Ambulatory Visit (HOSPITAL_COMMUNITY)
Admission: RE | Admit: 2011-05-23 | Discharge: 2011-05-23 | Disposition: A | Discharge status: Home / Self Care | Payer: PRIVATE HEALTH INSURANCE | Source: Ambulatory Visit | Attending: Obstetrics and Gynecology | Admitting: Obstetrics and Gynecology

## 2011-05-23 DIAGNOSIS — O3680X0 Pregnancy with inconclusive fetal viability, not applicable or unspecified: Secondary | ICD-10-CM

## 2011-05-23 DIAGNOSIS — Z3689 Encounter for other specified antenatal screening: Secondary | ICD-10-CM | POA: Insufficient documentation

## 2011-06-09 LAB — OB RESULTS CONSOLE RPR: RPR: NONREACTIVE

## 2011-06-09 LAB — OB RESULTS CONSOLE ABO/RH: RH Type: POSITIVE

## 2011-06-09 LAB — OB RESULTS CONSOLE HEPATITIS B SURFACE ANTIGEN: Hepatitis B Surface Ag: NEGATIVE

## 2011-06-09 LAB — OB RESULTS CONSOLE HIV ANTIBODY (ROUTINE TESTING): HIV: NONREACTIVE

## 2011-12-08 LAB — OB RESULTS CONSOLE GBS: GBS: NEGATIVE

## 2012-01-02 ENCOUNTER — Inpatient Hospital Stay (HOSPITAL_COMMUNITY)
Admission: AD | Admit: 2012-01-02 | Discharge: 2012-01-04 | DRG: 775 | Disposition: A | Discharge status: Home / Self Care | Payer: PRIVATE HEALTH INSURANCE | Source: Ambulatory Visit | Attending: Obstetrics and Gynecology | Admitting: Obstetrics and Gynecology

## 2012-01-02 ENCOUNTER — Encounter (HOSPITAL_COMMUNITY): Payer: Self-pay | Admitting: *Deleted

## 2012-01-02 DIAGNOSIS — Z3490 Encounter for supervision of normal pregnancy, unspecified, unspecified trimester: Secondary | ICD-10-CM

## 2012-01-02 LAB — CBC
HCT: 30.1 % — ABNORMAL LOW (ref 36.0–46.0)
Hemoglobin: 9.2 g/dL — ABNORMAL LOW (ref 12.0–15.0)
MCH: 20.4 pg — ABNORMAL LOW (ref 26.0–34.0)
MCHC: 30.6 g/dL (ref 30.0–36.0)
RDW: 16.7 % — ABNORMAL HIGH (ref 11.5–15.5)

## 2012-01-02 MED ORDER — DIBUCAINE 1 % RE OINT: 1.0000 "application " | TOPICAL_OINTMENT | RECTAL | Status: DC | PRN

## 2012-01-02 MED ORDER — PRENATAL MULTIVITAMIN CH
1.0000 | ORAL_TABLET | Freq: Every day | ORAL | Status: DC
  Administered 2012-01-03 – 2012-01-04 (×2): 1 via ORAL
  Filled 2012-01-02 (×2): qty 1

## 2012-01-02 MED ORDER — LIDOCAINE HCL (PF) 1 % IJ SOLN
30.0000 mL | INTRAMUSCULAR | Status: DC | PRN
  Administered 2012-01-02: 30 mL via SUBCUTANEOUS

## 2012-01-02 MED ORDER — BENZOCAINE-MENTHOL 20-0.5 % EX AERO
1.0000 "application " | INHALATION_SPRAY | CUTANEOUS | Status: DC | PRN
  Filled 2012-01-02: qty 56

## 2012-01-02 MED ORDER — ALBUTEROL SULFATE HFA 108 (90 BASE) MCG/ACT IN AERS: 2.0000 | INHALATION_SPRAY | Freq: Four times a day (QID) | RESPIRATORY_TRACT | Status: DC | PRN

## 2012-01-02 MED ORDER — IBUPROFEN 600 MG PO TABS: 600.0000 mg | ORAL_TABLET | Freq: Four times a day (QID) | ORAL | Status: DC | PRN

## 2012-01-02 MED ORDER — SIMETHICONE 80 MG PO CHEW: 80.0000 mg | CHEWABLE_TABLET | ORAL | Status: DC | PRN

## 2012-01-02 MED ORDER — METHYLERGONOVINE MALEATE 0.2 MG/ML IJ SOLN: 0.2000 mg | INTRAMUSCULAR | Status: DC | PRN

## 2012-01-02 MED ORDER — LACTATED RINGERS IV SOLN
INTRAVENOUS | Status: DC
  Administered 2012-01-02 (×2): via INTRAVENOUS

## 2012-01-02 MED ORDER — DIPHENHYDRAMINE HCL 25 MG PO CAPS: 25.0000 mg | ORAL_CAPSULE | Freq: Four times a day (QID) | ORAL | Status: DC | PRN

## 2012-01-02 MED ORDER — WITCH HAZEL-GLYCERIN EX PADS: 1.0000 "application " | MEDICATED_PAD | CUTANEOUS | Status: DC | PRN

## 2012-01-02 MED ORDER — MAGNESIUM HYDROXIDE 400 MG/5ML PO SUSP: 30.0000 mL | ORAL | Status: DC | PRN

## 2012-01-02 MED ORDER — LANOLIN HYDROUS EX OINT: TOPICAL_OINTMENT | CUTANEOUS | Status: DC | PRN

## 2012-01-02 MED ORDER — ZOLPIDEM TARTRATE 5 MG PO TABS: 5.0000 mg | ORAL_TABLET | Freq: Every evening | ORAL | Status: DC | PRN

## 2012-01-02 MED ORDER — SODIUM CHLORIDE 0.9 % IJ SOLN
INTRAMUSCULAR | Status: AC
  Filled 2012-01-02: qty 3

## 2012-01-02 MED ORDER — LACTATED RINGERS IV SOLN: 500.0000 mL | INTRAVENOUS | Status: DC | PRN

## 2012-01-02 MED ORDER — ONDANSETRON HCL 4 MG/2ML IJ SOLN: 4.0000 mg | Freq: Four times a day (QID) | INTRAMUSCULAR | Status: DC | PRN

## 2012-01-02 MED ORDER — OXYCODONE-ACETAMINOPHEN 5-325 MG PO TABS
1.0000 | ORAL_TABLET | ORAL | Status: DC | PRN
  Filled 2012-01-02: qty 2

## 2012-01-02 MED ORDER — LIDOCAINE HCL (PF) 1 % IJ SOLN
INTRAMUSCULAR | Status: AC
  Administered 2012-01-02: 30 mL via SUBCUTANEOUS
  Filled 2012-01-02: qty 30

## 2012-01-02 MED ORDER — OXYTOCIN BOLUS FROM INFUSION
500.0000 mL | Freq: Once | INTRAVENOUS | Status: DC
  Filled 2012-01-02: qty 500

## 2012-01-02 MED ORDER — OXYTOCIN 40 UNITS IN LACTATED RINGERS INFUSION - SIMPLE MED
62.5000 mL/h | Freq: Once | INTRAVENOUS | Status: AC
  Administered 2012-01-02: 999 mL/h via INTRAVENOUS

## 2012-01-02 MED ORDER — ONDANSETRON HCL 4 MG PO TABS: 4.0000 mg | ORAL_TABLET | ORAL | Status: DC | PRN

## 2012-01-02 MED ORDER — TETANUS-DIPHTH-ACELL PERTUSSIS 5-2.5-18.5 LF-MCG/0.5 IM SUSP: 0.5000 mL | Freq: Once | INTRAMUSCULAR | Status: DC

## 2012-01-02 MED ORDER — OXYTOCIN 40 UNITS IN LACTATED RINGERS INFUSION - SIMPLE MED
INTRAVENOUS | Status: AC
  Administered 2012-01-02: 999 mL/h via INTRAVENOUS
  Filled 2012-01-02: qty 1000

## 2012-01-02 MED ORDER — ACETAMINOPHEN 325 MG PO TABS: 650.0000 mg | ORAL_TABLET | ORAL | Status: DC | PRN

## 2012-01-02 MED ORDER — ONDANSETRON HCL 4 MG/2ML IJ SOLN: 4.0000 mg | INTRAMUSCULAR | Status: DC | PRN

## 2012-01-02 MED ORDER — MEASLES, MUMPS & RUBELLA VAC ~~LOC~~ INJ: 0.5000 mL | INJECTION | Freq: Once | SUBCUTANEOUS | Status: DC

## 2012-01-02 MED ORDER — OXYCODONE-ACETAMINOPHEN 5-325 MG PO TABS: 1.0000 | ORAL_TABLET | ORAL | Status: DC | PRN

## 2012-01-02 MED ORDER — IBUPROFEN 600 MG PO TABS
600.0000 mg | ORAL_TABLET | Freq: Four times a day (QID) | ORAL | Status: DC
  Administered 2012-01-02 – 2012-01-04 (×7): 600 mg via ORAL
  Filled 2012-01-02 (×8): qty 1

## 2012-01-02 MED ORDER — CITRIC ACID-SODIUM CITRATE 334-500 MG/5ML PO SOLN: 30.0000 mL | ORAL | Status: DC | PRN

## 2012-01-02 MED ORDER — SENNOSIDES-DOCUSATE SODIUM 8.6-50 MG PO TABS
2.0000 | ORAL_TABLET | Freq: Every day | ORAL | Status: DC
  Administered 2012-01-03: 2 via ORAL

## 2012-01-02 MED ORDER — METHYLERGONOVINE MALEATE 0.2 MG PO TABS: 0.2000 mg | ORAL_TABLET | ORAL | Status: DC | PRN

## 2012-01-02 NOTE — MAU Note (Signed)
Brought from lobby via wc- very uncomfortable

## 2012-01-02 NOTE — MAU Note (Signed)
Pt in c/o ucs every 2 minutes.  States ucs started last night.  Denies any leaking of fluid.  Reports small amount of bleeding.  + FM.

## 2012-01-02 NOTE — H&P (Signed)
Kayla Solis is a 31 y.o. female, G2 P0010, EGA 39+ weeks with EDC 9-25 presenting for evaluation of ctx.  On MAU having reg ctx, VE 4 cm dilated.  She was admitted and has continued to progress on her own.  SROM with thick mecomium.  Prenatal care complicated by isolated bilateral choroid plexus cysts by u/s, otherwise uncomplicated.  See prenatal records for complete history.  Maternal Medical History:  Reason for admission: Reason for admission: contractions.  Contractions: Frequency: regular.   Perceived severity is strong.    Fetal activity: Perceived fetal activity is normal.    Prenatal complications: no prenatal complications   OB History    Grav Para Term Preterm Abortions TAB SAB Ect Mult Living   2 0 0 0 1 0 1 0 0 0      Past Medical History  Diagnosis Date  . No pertinent past medical history   Asthma Depression  Past Surgical History  Procedure Date  . Tonsillectomy   . Dilation and curettage of uterus    Family History: family history includes Cancer in her maternal grandmother and mother. Social History:  reports that she has never smoked. She does not have any smokeless tobacco history on file. She reports that she does not drink alcohol or use illicit drugs.   Prenatal Transfer Tool  Maternal Diabetes: No Genetic Screening: Normal Maternal Ultrasounds/Referrals: Abnormal:  Findings:   Isolated choroid plexus cyst Fetal Ultrasounds or other Referrals:  None Maternal Substance Abuse:  No Significant Maternal Medications:  None Significant Maternal Lab Results:  Lab values include: Group B Strep negative Other Comments:  None  Review of Systems  Respiratory: Negative.   Cardiovascular: Negative.     Dilation: 9 Effacement (%): 100 Station: +1 Exam by:: Dr. Jackelyn Knife Blood pressure 111/79, pulse 132, temperature 97.3 F (36.3 C), temperature source Axillary, resp. rate 22, height 5\' 2"  (1.575 m), weight 79.379 kg (175 lb), last menstrual period  04/03/2011. Maternal Exam:  Uterine Assessment: Contraction strength is firm.  Contraction frequency is regular.   Abdomen: Patient reports no abdominal tenderness. Estimated fetal weight is 7 1/2 lbs.   Fetal presentation: vertex  Introitus: Normal vulva. Normal vagina.  Amniotic fluid character: meconium stained.  Pelvis: adequate for delivery.   Cervix: Cervix evaluated by digital exam.     Fetal Exam Fetal Monitor Review: Mode: ultrasound.   Baseline rate: 160.  Variability: moderate (6-25 bpm).   Pattern: accelerations present and variable decelerations.    Fetal State Assessment: Category II - tracings are indeterminate.     Physical Exam  Constitutional: She appears well-developed and well-nourished.  Cardiovascular: Normal rate, regular rhythm and normal heart sounds.   No murmur heard. Respiratory: Effort normal and breath sounds normal. No respiratory distress. She has no wheezes.  GI: Soft.       Gravid     Prenatal labs: ABO, Rh: A/Positive/-- (02/25 0000) Antibody: Negative (02/25 0000) Rubella: Immune (02/25 0000) RPR: Nonreactive (02/25 0000)  HBsAg: Negative (02/25 0000)  HIV: Non-reactive (02/25 0000)  GBS: Negative (08/26 0000)  GCT:  114  Assessment/Plan: IUP at 39+ weeks in active labor, SROM with thick mecomium.  Monitor progress, anticipate SVD.     Aric Jost D 01/02/2012, 12:02 PM

## 2012-01-02 NOTE — Consult Note (Signed)
Called to attend this vaginal delivery for thick MSAF.  NICU delivery team arrived and infant delivered within 5 minutes of our arrival.  Infant came out crying vigorously and NICU delivery team was excused by Dr. Jackelyn Knife.   Overton Mam, MD (Attending Neonatologist)

## 2012-01-03 NOTE — Progress Notes (Signed)
PPD #1 No problems Afeb, VSS Fundus firm, NT at U-1 Continue routine postpartum care 

## 2012-01-04 NOTE — Progress Notes (Signed)
PPD #2 No problems Afeb, VSS Fundus firm, NT at U-1 Continue routine postpartum care, d/c home 

## 2012-01-04 NOTE — Discharge Summary (Signed)
Obstetric Discharge Summary Reason for Admission: onset of labor Prenatal Procedures: none Intrapartum Procedures: spontaneous vaginal delivery Postpartum Procedures: none Complications-Operative and Postpartum: 2nd degree perineal laceration Hemoglobin  Date Value Range Status  01/02/2012 9.2* 12.0 - 15.0 g/dL Final     HCT  Date Value Range Status  01/02/2012 30.1* 36.0 - 46.0 % Final    Physical Exam:  General: alert Lochia: appropriate Uterine Fundus: firm  Discharge Diagnoses: Term Pregnancy-delivered  Discharge Information: Date: 01/04/2012 Activity: pelvic rest Diet: routine Medications: Ibuprofen Condition: stable Instructions: refer to practice specific booklet Discharge to: home Follow-up Information    Follow up with Lenetta Piche D, MD. Schedule an appointment as soon as possible for a visit in 6 weeks.   Contact information:   [address], [address] [place] [address] [phone]          Newborn Data: Live born female  Birth Weight: 8 lb 9.9 oz (3910 g) APGAR: 8, 9  Home with mother.  Marie Borowski D 01/04/2012, 8:42 AM

## 2012-02-01 ENCOUNTER — Emergency Department: Payer: Self-pay | Admitting: Emergency Medicine

## 2012-06-24 ENCOUNTER — Other Ambulatory Visit: Payer: Self-pay

## 2012-09-14 ENCOUNTER — Ambulatory Visit (INDEPENDENT_AMBULATORY_CARE_PROVIDER_SITE_OTHER): Payer: PRIVATE HEALTH INSURANCE | Admitting: Family Medicine

## 2012-09-14 ENCOUNTER — Encounter: Payer: Self-pay | Admitting: Family Medicine

## 2012-09-14 VITALS — BP 100/64 | HR 75 | Temp 98.2°F | Ht 62.0 in | Wt 138.5 lb

## 2012-09-14 DIAGNOSIS — R109 Unspecified abdominal pain: Secondary | ICD-10-CM

## 2012-09-14 LAB — POCT URINALYSIS DIPSTICK
Bilirubin, UA: NEGATIVE
Ketones, UA: NEGATIVE
Nitrite, UA: NEGATIVE

## 2012-09-14 NOTE — Assessment & Plan Note (Signed)
Urine clear. Not typical UTI per symptoms at time treated for UTI at Urgent Care. Neg Upreg despite no menses at Urgent Care.  Most liekly gas or GI source. ? IBS  Push fluids, helathy diet, avoid greasy foods. If pain returns follow up.

## 2012-09-14 NOTE — Progress Notes (Signed)
  Subjective:    Patient ID: Kayla Solis, female    DOB: [birth year], 32 y.o.   MRN: [id number]  HPI 32 year old female  presents with  abdominal pain lasting 2 days last week. She was seen on 5/28 at urgent care. Pain was left of umbilicus. Pain dull ache constantly with sharp 10/10 pain lasting 1 hour at a time on those 2 days. Some associated nausea. She was  And is not having any urinary symptoms, no dysuria, no fever.  Treated for UTI 5/28 with nitrofurantoin 5 days worth. Upreg at that time was negative per pt.  She felt better until last night she had cramping/stretching pain left of umbilicus. Lasted 30 minutes. No associated symptoms.  NO N/V, no diarrhea, no constipation.  Last BM this AM.  She has an 74 month old.. Last menses was 8 months ago as well, on progesterone only given she is breast feeding.   Review of Systems  Constitutional: Negative for fever and fatigue.  HENT: Negative for ear pain.   Eyes: Negative for pain.  Respiratory: Negative for chest tightness and shortness of breath.   Cardiovascular: Negative for chest pain, palpitations and leg swelling.  Gastrointestinal: Negative for abdominal pain.  Genitourinary: Negative for dysuria.       Objective:   Physical Exam  Constitutional: Vital signs are normal. She appears well-developed and well-nourished. She is cooperative.  Non-toxic appearance. She does not appear ill. No distress.  HENT:  Head: Normocephalic.  Right Ear: Hearing, tympanic membrane, external ear and ear canal normal. Tympanic membrane is not erythematous, not retracted and not bulging.  Left Ear: Hearing, tympanic membrane, external ear and ear canal normal. Tympanic membrane is not erythematous, not retracted and not bulging.  Nose: No mucosal edema or rhinorrhea. Right sinus exhibits no maxillary sinus tenderness and no frontal sinus tenderness. Left sinus exhibits no maxillary sinus tenderness and no frontal sinus tenderness.   Mouth/Throat: Uvula is midline, oropharynx is clear and moist and mucous membranes are normal.  Eyes: Conjunctivae, EOM and lids are normal. Pupils are equal, round, and reactive to light. No foreign bodies found.  Neck: Trachea normal and normal range of motion. Neck supple. Carotid bruit is not present. No mass and no thyromegaly present.  Cardiovascular: Normal rate, regular rhythm, S1 normal, S2 normal, normal heart sounds, intact distal pulses and normal pulses.  Exam reveals no gallop and no friction rub.   No murmur heard. Pulmonary/Chest: Effort normal and breath sounds normal. Not tachypneic. No respiratory distress. She has no decreased breath sounds. She has no wheezes. She has no rhonchi. She has no rales.  Abdominal: Soft. Normal appearance and bowel sounds are normal. There is no hepatosplenomegaly. There is no tenderness. There is no rigidity, no rebound, no guarding and no CVA tenderness. No hernia.  Neurological: She is alert.  Skin: Skin is warm, dry and intact. No rash noted.  Psychiatric: Her speech is normal and behavior is normal. Judgment and thought content normal. Her mood appears not anxious. Cognition and memory are normal. She does not exhibit a depressed mood.          Assessment & Plan:

## 2012-09-14 NOTE — Patient Instructions (Signed)
Push water, healthy low gas producing diet. If pain returns regularly through the week call for further evaluation.

## 2012-11-01 ENCOUNTER — Encounter: Payer: Self-pay | Admitting: Family Medicine

## 2012-11-01 ENCOUNTER — Ambulatory Visit (INDEPENDENT_AMBULATORY_CARE_PROVIDER_SITE_OTHER): Payer: PRIVATE HEALTH INSURANCE | Admitting: Family Medicine

## 2012-11-01 VITALS — BP 104/60 | HR 78 | Temp 98.3°F | Wt 135.2 lb

## 2012-11-01 DIAGNOSIS — H109 Unspecified conjunctivitis: Secondary | ICD-10-CM

## 2012-11-01 MED ORDER — ERYTHROMYCIN 5 MG/GM OP OINT: TOPICAL_OINTMENT | Freq: Four times a day (QID) | OPHTHALMIC | Status: DC

## 2012-11-01 NOTE — Patient Instructions (Addendum)
Use the ointment 3-4 times a day and wash your hands.  Take care.  Don't use your contacts until the symptoms have resolved for several days.  Take care.

## 2012-11-01 NOTE — Assessment & Plan Note (Signed)
Presumed bacterial.  Start topical erythromycin today and f/u prn.  Hand washing, out of contacts for the duration.  She agrees.

## 2012-11-01 NOTE — Progress Notes (Signed)
Recent with mild URI sx, resolving.  Today with L eye drainage, purulent.  No vision changes once discharge removed.  Out of contacts now.  No R eye sx.  Feels well o/w.  Possible sick exposures at work.  No trauma, no h/o FB.   Meds, vitals, and allergies reviewed.   ROS: See HPI.  Otherwise, noncontributory.  nad ncat Mmm TM wnl Nasal and OP exam wnl  PERRL, EOMI No FB noted on B eyes.  R conjunctiva wnl, L conjunctiva injected with purulent discharge noted

## 2013-02-17 ENCOUNTER — Other Ambulatory Visit: Payer: Self-pay

## 2013-03-17 ENCOUNTER — Telehealth: Payer: Self-pay | Admitting: Family Medicine

## 2013-03-17 NOTE — Telephone Encounter (Signed)
Patient Information:  Caller Name: Kayla Solis  Phone: [phone]  Patient: Kayla Solis  Gender: Female  DOB: 27-Jul-1980  Age: 32 Years  PCP: Kerby Nora (Family Practice)  Pregnant: No  Office Follow Up:  Does the office need to follow up with this patient?: No  Instructions For The Office: N/A  RN Note:  Care advice and call back parameters reviewed. Undestaanding expressed. Encouraged Home pregnancy test.  Symptoms  Reason For Call & Symptoms: Patient states Headache pain since Saturday 03/12/13. Constant on left side of head and down toward back of neck.  No history of migraines. Treatment with Advil. It will lessen pain but never goes away.  Described as "dull ache".   She is currently Breastfeeding, no birth control, sexually active. LMP- ? unsure 03/2011  Reviewed Health History In EMR: Yes  Reviewed Medications In EMR: Yes  Reviewed Allergies In EMR: Yes  Reviewed Surgeries / Procedures: Yes  Date of Onset of Symptoms: 03/12/2013  Treatments Tried: Advil  Treatments Tried Worked: No OB / GYN:  LMP: Unknown  Guideline(s) Used:  Headache  Disposition Per Guideline:   See Today or Tomorrow in Office  Reason For Disposition Reached:   Unexplained headache that is present > 24 hours  Advice Given:  Pain Medicines:  For pain relief, you can take either acetaminophen, ibuprofen, or naproxen.  They are over-the-counter (OTC) pain drugs. You can buy them at the drugstore.  Rest:   Lie down in a dark, quiet place and try to relax. Close your eyes and imagine your entire body relaxing.  Apply Cold to the Area:   Apply a cold wet washcloth or cold pack to the forehead for 20 minutes.  Call Back If:  Headache lasts longer than 24 hours  You become worse.  Patient Will Follow Care Advice:  YES  Appointment Scheduled:  03/18/2013 00:00:00 Appointment Scheduled Provider:  Crawford Givens Clelia Croft) [facility])

## 2013-03-17 NOTE — Telephone Encounter (Signed)
Will see tomorrow

## 2013-03-18 ENCOUNTER — Ambulatory Visit (INDEPENDENT_AMBULATORY_CARE_PROVIDER_SITE_OTHER): Payer: PRIVATE HEALTH INSURANCE | Admitting: Family Medicine

## 2013-03-18 ENCOUNTER — Encounter: Payer: Self-pay | Admitting: Family Medicine

## 2013-03-18 VITALS — BP 102/60 | HR 70 | Temp 98.4°F | Wt 130.0 lb

## 2013-03-18 DIAGNOSIS — R51 Headache: Secondary | ICD-10-CM

## 2013-03-18 NOTE — Progress Notes (Signed)
Pre-visit discussion using our clinic review tool. No additional management support is needed unless otherwise documented below in the visit note.  Headache for a week, steady.  Started on a Saturday.  L sided. Worse with sneezing, exertion, certain movements. Taking advil helped some, temporarily. No R sided sx.  Near the L temple. No vision changes. Occ sweats.  Some nausea, no vomiting.  Dec in appetite. Taking a hot shower may help some. No ear pain. No rhinorrhea.  No ST.  No cough. No rash.  No pain chewing.  No h/o similar HA. No focal neuro sx.  No h/o migraines. Not worse with light or sound.  Doesn't throb unless she sneezes. No tooth pain.    Meds, vitals, and allergies reviewed.   ROS: See HPI.  Otherwise, noncontributory.  nad ncat Tm w/o erythema but B SOM Nasal exam slightly stuffy OP with mild posterior irritation L lateral frontal sinus mildly ttp L temple not ttp CN 2-12 wnl B, S/S/DTR wnl x4 Neck supple, no LA rrr ctab

## 2013-03-18 NOTE — Patient Instructions (Signed)
Take tylenol and use warm compresses for pain.  Drink plenty of fluids and use nasal saline. If not better, then notify us.  Take care.

## 2013-03-20 ENCOUNTER — Encounter: Payer: Self-pay | Admitting: Family Medicine

## 2013-03-20 DIAGNOSIS — R51 Headache: Secondary | ICD-10-CM | POA: Insufficient documentation

## 2013-03-20 NOTE — Assessment & Plan Note (Signed)
Likely from sinusitis.  If not improved, we can start abx.  She agrees to give this more time.  Not severe sx.  Use nasal saline and warm compresses in the meantime.  Nontoxic.

## 2013-03-21 NOTE — Telephone Encounter (Signed)
Called and spoke with Alcario Drought.  Advised E-Chart messages are usually not checked on the weekend.  I ask how she was doing.  She states she took some Advil and it helped.  I ask if she felt like she needed to be seen today.  She states the last time she came in she was told there was nothing that could be done for her so she really doesn't want to be seen today.  She states the headaches keep waking her up at night and are painful.  I advised Dr. Ermalene Searing would be back in clinic on Tuesday and I would forward this message to her to see if she had any recommendations.

## 2013-03-22 ENCOUNTER — Telehealth: Payer: Self-pay | Admitting: Family Medicine

## 2013-03-22 MED ORDER — AZITHROMYCIN 250 MG PO TABS: ORAL_TABLET | ORAL | Status: DC

## 2013-03-22 NOTE — Telephone Encounter (Signed)
Lugene, please call pt.  I would treat her for a frontal sinusitis.  See OV note.  Thanks.  Rx sent.  If not improved, then f/u here with PCP.  Thanks.  I think I  recall that she was breast feeding, this should be okay to use.    Routed to PCP as FYI.

## 2013-03-22 NOTE — Telephone Encounter (Signed)
Left detailed message on voicemail.  

## 2014-02-13 ENCOUNTER — Encounter: Payer: Self-pay | Admitting: Family Medicine

## 2014-02-24 ENCOUNTER — Encounter (HOSPITAL_COMMUNITY): Payer: Self-pay | Admitting: *Deleted

## 2014-02-28 ENCOUNTER — Other Ambulatory Visit (HOSPITAL_COMMUNITY): Payer: Self-pay | Admitting: Obstetrics and Gynecology

## 2014-02-28 NOTE — H&P (Signed)
NAMEANAYI, BRICCO                 ACCOUNT NO.:  [id number]  MEDICAL RECORD NO.:  [id number]  LOCATION:  [facility]                         FACILITY:  [facility]  PHYSICIAN:  Lucille Passy. Ulanda Edison, M.D. DATE OF BIRTH:  1981/03/05  DATE OF ADMISSION:  02/23/2014 DATE OF DISCHARGE:                             HISTORY & PHYSICAL   HISTORY OF PRESENT ILLNESS:  This patient had an ultrasound on February 15, 2014.  She had come for an ultrasound and the ultrasound showed a crown-rump length of 4.3 mm and it was uncertain whether it was because of the early pregnancy or a nonviable pregnancy that the fetal heart rate was not seen, so the patient underwent another ultrasound on February 22, 2014 which again showed a nonviable pregnancy.  The ultrasound on that date showed a crown-rump length of 3.1 mm.  She was offered observation, prostaglandins, D and C.  She chose to proceed with D and C  Her last period was December 24, 2013, which should put her at over [redacted] weeks gestation and at the time of the second ultrasound 8 weeks 4 days, and the crown-rump length placed her at 5 weeks and 6 days.  PAST MEDICAL HISTORY:  Reveals a history of depression that is resolved, had a history of asthma in college.  PAST SURGICAL HISTORY:  Wisdom teeth extracted in high school, T and A in 2005, and D and C in February, 2012.  ALLERGIES:  No known drug allergies.  No latex or food allergies.  SOCIAL HISTORY:  Never smoked.  Moderate alcohol intake.  No drugs. Four years college degree in communications from [employer].  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  She is a vegetarian.  Husband is a Engineer, structural.  Married since 2010.  FAMILY HISTORY:  Mother with cancer of the breast at age 51.  Dad with heart disease at age 24, coronary bypass graft x4, osteoporosis. Maternal grandmother with cancer of the colon.  Paternal grandmother with high blood pressure.  Maternal grandfather with  heart attack, died at age 65,  Sister with DVT at age 20.  The patient has had 2 miscarriages.  She has had a full term delivery and she has had this pregnancy.  PHYSICAL EXAM:  VITAL SIGNS:  Normal. HEART:  Normal size and sounds.  No murmurs. LUNGS:  Clear to auscultation. ABDOMEN:  Soft without masses. PELVIC:  Uterus is compatible with a 6-7 week pregnancy.  ADMITTING IMPRESSION:  Nonviable intrauterine pregnancy.  The patient is admitted for D and C.  She understands the risks and is ready to proceed.     Lucille Passy. Ulanda Edison, M.D.     TFH/MEDQ  D:  02/28/2014  T:  02/28/2014  Job:  [id number]

## 2014-03-01 ENCOUNTER — Encounter (HOSPITAL_COMMUNITY)
Admission: RE | Disposition: A | Discharge status: Home / Self Care | Payer: Self-pay | Source: Ambulatory Visit | Attending: Obstetrics and Gynecology

## 2014-03-01 ENCOUNTER — Ambulatory Visit (HOSPITAL_COMMUNITY): Payer: PRIVATE HEALTH INSURANCE | Admitting: Certified Registered Nurse Anesthetist

## 2014-03-01 ENCOUNTER — Encounter (HOSPITAL_COMMUNITY): Payer: Self-pay | Admitting: *Deleted

## 2014-03-01 ENCOUNTER — Ambulatory Visit (HOSPITAL_COMMUNITY)
Admission: RE | Admit: 2014-03-01 | Discharge: 2014-03-01 | Disposition: A | Discharge status: Home / Self Care | Payer: PRIVATE HEALTH INSURANCE | Source: Ambulatory Visit | Attending: Obstetrics and Gynecology | Admitting: Obstetrics and Gynecology

## 2014-03-01 DIAGNOSIS — O021 Missed abortion: Secondary | ICD-10-CM | POA: Diagnosis not present

## 2014-03-01 HISTORY — PX: DILATION AND CURETTAGE OF UTERUS: SHX78

## 2014-03-01 LAB — CBC WITH DIFFERENTIAL/PLATELET
Basophils Absolute: 0 10*3/uL (ref 0.0–0.1)
Basophils Relative: 0 % (ref 0–1)
Eosinophils Absolute: 0.2 10*3/uL (ref 0.0–0.7)
Eosinophils Relative: 2 % (ref 0–5)
HEMATOCRIT: 37.4 % (ref 36.0–46.0)
HEMOGLOBIN: 12.5 g/dL (ref 12.0–15.0)
Lymphocytes Relative: 45 % (ref 12–46)
Lymphs Abs: 3 10*3/uL (ref 0.7–4.0)
MCH: 27.5 pg (ref 26.0–34.0)
MCHC: 33.4 g/dL (ref 30.0–36.0)
MCV: 82.2 fL (ref 78.0–100.0)
MONO ABS: 0.7 10*3/uL (ref 0.1–1.0)
Monocytes Relative: 10 % (ref 3–12)
Neutro Abs: 2.9 10*3/uL (ref 1.7–7.7)
Neutrophils Relative %: 43 % (ref 43–77)
Platelets: 197 10*3/uL (ref 150–400)
RBC: 4.55 MIL/uL (ref 3.87–5.11)
RDW: 13.4 % (ref 11.5–15.5)
WBC: 6.6 10*3/uL (ref 4.0–10.5)

## 2014-03-01 LAB — URINALYSIS, ROUTINE W REFLEX MICROSCOPIC
BILIRUBIN URINE: NEGATIVE
Glucose, UA: NEGATIVE mg/dL
Ketones, ur: NEGATIVE mg/dL
Leukocytes, UA: NEGATIVE
NITRITE: NEGATIVE
PROTEIN: NEGATIVE mg/dL
Specific Gravity, Urine: 1.02 (ref 1.005–1.030)
UROBILINOGEN UA: 0.2 mg/dL (ref 0.0–1.0)
pH: 7 (ref 5.0–8.0)

## 2014-03-01 LAB — COMPREHENSIVE METABOLIC PANEL
ALK PHOS: 35 U/L — AB (ref 39–117)
ALT: 27 U/L (ref 0–35)
AST: 21 U/L (ref 0–37)
Albumin: 4.1 g/dL (ref 3.5–5.2)
Anion gap: 10 (ref 5–15)
BUN: 7 mg/dL (ref 6–23)
CALCIUM: 9.1 mg/dL (ref 8.4–10.5)
CO2: 25 meq/L (ref 19–32)
Chloride: 101 mEq/L (ref 96–112)
Creatinine, Ser: 0.52 mg/dL (ref 0.50–1.10)
GLUCOSE: 86 mg/dL (ref 70–99)
Potassium: 3.5 mEq/L — ABNORMAL LOW (ref 3.7–5.3)
Sodium: 136 mEq/L — ABNORMAL LOW (ref 137–147)
Total Bilirubin: 0.7 mg/dL (ref 0.3–1.2)
Total Protein: 7.3 g/dL (ref 6.0–8.3)

## 2014-03-01 LAB — URINE MICROSCOPIC-ADD ON

## 2014-03-01 SURGERY — DILATION AND CURETTAGE
Anesthesia: General | Site: Vagina

## 2014-03-01 MED ORDER — LIDOCAINE HCL 1 % IJ SOLN
INTRAMUSCULAR | Status: DC | PRN
  Administered 2014-03-01: 10 mL

## 2014-03-01 MED ORDER — ONDANSETRON HCL 4 MG/2ML IJ SOLN
INTRAMUSCULAR | Status: AC
  Filled 2014-03-01: qty 2

## 2014-03-01 MED ORDER — LIDOCAINE HCL 1 % IJ SOLN
INTRAMUSCULAR | Status: AC
  Filled 2014-03-01: qty 20

## 2014-03-01 MED ORDER — MIDAZOLAM HCL 2 MG/2ML IJ SOLN
INTRAMUSCULAR | Status: AC
  Filled 2014-03-01: qty 2

## 2014-03-01 MED ORDER — MIDAZOLAM HCL 2 MG/2ML IJ SOLN: 0.5000 mg | Freq: Once | INTRAMUSCULAR | Status: DC | PRN

## 2014-03-01 MED ORDER — PROMETHAZINE HCL 25 MG/ML IJ SOLN: 6.2500 mg | INTRAMUSCULAR | Status: DC | PRN

## 2014-03-01 MED ORDER — KETOROLAC TROMETHAMINE 30 MG/ML IJ SOLN: 15.0000 mg | Freq: Once | INTRAMUSCULAR | Status: DC | PRN

## 2014-03-01 MED ORDER — MIDAZOLAM HCL 2 MG/2ML IJ SOLN
INTRAMUSCULAR | Status: DC | PRN
  Administered 2014-03-01: 2 mg via INTRAVENOUS

## 2014-03-01 MED ORDER — FENTANYL CITRATE 0.05 MG/ML IJ SOLN: 25.0000 ug | INTRAMUSCULAR | Status: DC | PRN

## 2014-03-01 MED ORDER — KETOROLAC TROMETHAMINE 30 MG/ML IJ SOLN
INTRAMUSCULAR | Status: DC | PRN
  Administered 2014-03-01: 30 mg via INTRAVENOUS

## 2014-03-01 MED ORDER — DEXAMETHASONE SODIUM PHOSPHATE 10 MG/ML IJ SOLN
INTRAMUSCULAR | Status: DC | PRN
  Administered 2014-03-01: 4 mg via INTRAVENOUS

## 2014-03-01 MED ORDER — DEXAMETHASONE SODIUM PHOSPHATE 4 MG/ML IJ SOLN
INTRAMUSCULAR | Status: AC
  Filled 2014-03-01: qty 1

## 2014-03-01 MED ORDER — FENTANYL CITRATE 0.05 MG/ML IJ SOLN
INTRAMUSCULAR | Status: AC
  Filled 2014-03-01: qty 2

## 2014-03-01 MED ORDER — IBUPROFEN 600 MG PO TABS: 600.0000 mg | ORAL_TABLET | Freq: Four times a day (QID) | ORAL | Status: DC | PRN

## 2014-03-01 MED ORDER — LIDOCAINE HCL (CARDIAC) 20 MG/ML IV SOLN
INTRAVENOUS | Status: DC | PRN
  Administered 2014-03-01: 50 mg via INTRAVENOUS

## 2014-03-01 MED ORDER — KETOROLAC TROMETHAMINE 30 MG/ML IJ SOLN
INTRAMUSCULAR | Status: AC
  Filled 2014-03-01: qty 1

## 2014-03-01 MED ORDER — PROPOFOL 10 MG/ML IV BOLUS
INTRAVENOUS | Status: DC | PRN
  Administered 2014-03-01: 150 mg via INTRAVENOUS

## 2014-03-01 MED ORDER — ONDANSETRON HCL 4 MG/2ML IJ SOLN
INTRAMUSCULAR | Status: DC | PRN
  Administered 2014-03-01: 4 mg via INTRAVENOUS

## 2014-03-01 MED ORDER — PROPOFOL 10 MG/ML IV EMUL
INTRAVENOUS | Status: AC
  Filled 2014-03-01: qty 20

## 2014-03-01 MED ORDER — SCOPOLAMINE 1 MG/3DAYS TD PT72: 1.0000 | MEDICATED_PATCH | Freq: Once | TRANSDERMAL | Status: DC

## 2014-03-01 MED ORDER — FENTANYL CITRATE 0.05 MG/ML IJ SOLN
INTRAMUSCULAR | Status: DC | PRN
  Administered 2014-03-01 (×2): 50 ug via INTRAVENOUS

## 2014-03-01 MED ORDER — LACTATED RINGERS IV SOLN
INTRAVENOUS | Status: DC
  Administered 2014-03-01 (×2): via INTRAVENOUS

## 2014-03-01 MED ORDER — MEPERIDINE HCL 25 MG/ML IJ SOLN: 6.2500 mg | INTRAMUSCULAR | Status: DC | PRN

## 2014-03-01 MED ORDER — LIDOCAINE HCL (CARDIAC) 20 MG/ML IV SOLN
INTRAVENOUS | Status: AC
  Filled 2014-03-01: qty 5

## 2014-03-01 SURGICAL SUPPLY — 14 items
CATH ROBINSON RED A/P 16FR (CATHETERS) ×3 IMPLANT
CLOTH BEACON ORANGE TIMEOUT ST (SAFETY) ×3 IMPLANT
CONTAINER PREFILL 10% NBF 60ML (FORM) IMPLANT
GLOVE BIO SURGEON STRL SZ7.5 (GLOVE) ×3 IMPLANT
GOWN STRL REUS W/TWL LRG LVL3 (GOWN DISPOSABLE) ×6 IMPLANT
KIT BERKELEY 2ND TRIMESTER 1/2 (COLLECTOR) ×3 IMPLANT
PACK VAGINAL MINOR WOMEN LF (CUSTOM PROCEDURE TRAY) ×3 IMPLANT
PAD OB MATERNITY 4.3X12.25 (PERSONAL CARE ITEMS) ×3 IMPLANT
PAD PREP 24X48 CUFFED NSTRL (MISCELLANEOUS) ×3 IMPLANT
SET BERKELEY SUCTION TUBING (SUCTIONS) ×3 IMPLANT
SUT CHROMIC 2 0 SH (SUTURE) ×3 IMPLANT
TOWEL OR 17X24 6PK STRL BLUE (TOWEL DISPOSABLE) ×6 IMPLANT
VACURETTE 8 RIGID CVD (CANNULA) ×3 IMPLANT
WATER STERILE IRR 1000ML POUR (IV SOLUTION) ×3 IMPLANT

## 2014-03-01 NOTE — Progress Notes (Signed)
Patient ID: Kayla Solis, female   DOB: [birth year], 33 y.o.   MRN: [id number] Op note:  Pre op dx: Non viable IUP Post op dx : Same  Operation Suction D&C   oPERATOR : Rimas Gilham  General anesthesia and 10 cc's 1% local xylocaine   EBL 25 cc's

## 2014-03-01 NOTE — Progress Notes (Signed)
Patient ID: Kayla Solis, female   DOB: Dec 17, 1980, 33 y.o.   MRN: [id number] I examined this lady today and she has no current health problems

## 2014-03-01 NOTE — Anesthesia Preprocedure Evaluation (Signed)
Anesthesia Evaluation  Patient identified by MRN, date of birth, ID band Patient awake    Reviewed: Allergy & Precautions, H&P , NPO status , Patient's Chart, lab work & pertinent test results  Airway Mallampati: I  TM Distance: >3 FB Neck ROM: full    Dental no notable dental hx. (+) Teeth Intact   Pulmonary    Pulmonary exam normal       Cardiovascular negative cardio ROS      Neuro/Psych    GI/Hepatic negative GI ROS, Neg liver ROS,   Endo/Other  negative endocrine ROS  Renal/GU negative Renal ROS     Musculoskeletal   Abdominal Normal abdominal exam  (+)   Peds  Hematology negative hematology ROS (+)   Anesthesia Other Findings   Reproductive/Obstetrics negative OB ROS                             Anesthesia Physical Anesthesia Plan  ASA: II  Anesthesia Plan: General   Post-op Pain Management:    Induction: Intravenous  Airway Management Planned: LMA  Additional Equipment:   Intra-op Plan:   Post-operative Plan:   Informed Consent: I have reviewed the patients History and Physical, chart, labs and discussed the procedure including the risks, benefits and alternatives for the proposed anesthesia with the patient or authorized representative who has indicated his/her understanding and acceptance.     Plan Discussed with: CRNA and Surgeon  Anesthesia Plan Comments:         Anesthesia Quick Evaluation

## 2014-03-01 NOTE — Anesthesia Postprocedure Evaluation (Signed)
Anesthesia Post Note  Patient: Kayla Solis  Procedure(s) Performed: Procedure(s) (LRB): DILATATION AND CURETTAGE (N/A)  Anesthesia type: GA  Patient location: PACU  Post pain: Pain level controlled  Post assessment: Post-op Vital signs reviewed  Last Vitals:  Filed Vitals:   03/01/14 1300  BP:   Pulse: 63  Temp: 36.9 C  Resp: 15    Post vital signs: Reviewed  Level of consciousness: sedated  Complications: No apparent anesthesia complications

## 2014-03-01 NOTE — Transfer of Care (Signed)
Immediate Anesthesia Transfer of Care Note  Patient: Kayla Solis  Procedure(s) Performed: Procedure(s): DILATATION AND CURETTAGE (N/A)  Patient Location: PACU  Anesthesia Type:General  Level of Consciousness: awake, alert , oriented and patient cooperative  Airway & Oxygen Therapy: Patient Spontanous Breathing and Patient connected to nasal cannula oxygen  Post-op Assessment: Report given to PACU RN and Post -op Vital signs reviewed and stable  Post vital signs: Reviewed and stable  Complications: No apparent anesthesia complications

## 2014-03-01 NOTE — Discharge Instructions (Addendum)
No vaginal entrance for 1 week. Call with temp > 100.4 degrees , with heavy bleeding or any unusual problems.    DO NOT TAKE ANY IBUPROFEN PRODUCTS UNTIL 6 PM TODAY.    DISCHARGE INSTRUCTIONS: D&C / D&E The following instructions have been prepared to help you care for yourself upon your return home.   Personal hygiene:  Use sanitary pads for vaginal drainage, not tampons.  Shower the day after your procedure.  NO tub baths, pools or Jacuzzis for 2-3 weeks.  Wipe front to back after using the bathroom.  Activity and limitations:  Do NOT drive or operate any equipment for 24 hours. The effects of anesthesia are still present and drowsiness may result.  Do NOT rest in bed all day.  Walking is encouraged.  Walk up and down stairs slowly.  You may resume your normal activity in one to two days or as indicated by your physician.  Sexual activity: NO intercourse for at least 2 weeks after the procedure, or as indicated by your physician.  Diet: Eat a light meal as desired this evening. You may resume your usual diet tomorrow.  Return to work: You may resume your work activities in one to two days or as indicated by your doctor.  What to expect after your surgery: Expect to have vaginal bleeding/discharge for 2-3 days and spotting for up to 10 days. It is not unusual to have soreness for up to 1-2 weeks. You may have a slight burning sensation when you urinate for the first day. Mild cramps may continue for a couple of days. You may have a regular period in 2-6 weeks.  Call your doctor for any of the following:  Excessive vaginal bleeding, saturating and changing one pad every hour.  Inability to urinate 6 hours after discharge from hospital.  Pain not relieved by pain medication.  Fever of 100.4 F or greater.  Unusual vaginal discharge or odor.   Call for an appointment:    Patients signature: ______________________  Nurses signature  ________________________  Support person's signature_______________________

## 2014-03-01 NOTE — Op Note (Signed)
Kayla Solis, Kayla Solis                 ACCOUNT NO.:  [id number]  MEDICAL RECORD NO.:  [id number]  LOCATION:  [facility]                          FACILITY:  [facility]  PHYSICIAN:  Lucille Passy. Ulanda Edison, M.D. DATE OF BIRTH:  [birth year]  DATE OF PROCEDURE:  03/01/2014 DATE OF DISCHARGE:                              OPERATIVE REPORT   PREOPERATIVE DIAGNOSIS:  Nonviable intrauterine pregnancy.  POSTOPERATIVE DIAGNOSIS:  Nonviable intrauterine pregnancy.  OPERATION:  Suction dilation and curettage.  OPERATOR:  Lucille Passy. Ulanda Edison, MD  ANESTHESIA:  General.  DESCRIPTION OF PROCEDURE:  The patient was brought to the operating room and had general anesthesia induced and an airway without the endotracheal tube was established.  The patient was placed in lithotomy position.  A time-out was done, and the vulva, vagina, perineum, and urethra were prepped with Betadine solution.  The bladder was emptied with a [place] catheter.  Exam revealed the uterus to be mid plane, upper limit of normal size.  The adnexa were free of masses.  The cervix was grasped with a tenaculum.  It would not accept uterine sounds, so I used a smallest dilator sounded to 8 cm slightly anteriorly.  Cervical canal was then dilated so that it would admit a #8 curved suction curette. Curettage was done producing a placental tissue.  Sharp D and C was done to ensure that the walls of the cavity was smooth.  Final circuit with the suction produced no additional tissue.  Procedure was terminated except for some bleeding from the anterior cervical lip where it had been grasped with a tenaculum and this was sutured with a 2-0 chromic suture.  Hemostasis was complete.  The patient was returned to recovery in satisfactory condition.  Blood loss about 25 mL.     Lucille Passy. Ulanda Edison, M.D.     TFH/MEDQ  D:  03/01/2014  T:  03/01/2014  Job:  [id number]

## 2014-03-02 ENCOUNTER — Encounter (HOSPITAL_COMMUNITY): Payer: Self-pay | Admitting: Obstetrics and Gynecology

## 2014-05-08 ENCOUNTER — Ambulatory Visit (INDEPENDENT_AMBULATORY_CARE_PROVIDER_SITE_OTHER): Payer: PRIVATE HEALTH INSURANCE | Admitting: Internal Medicine

## 2014-05-08 ENCOUNTER — Encounter: Payer: Self-pay | Admitting: Internal Medicine

## 2014-05-08 VITALS — BP 110/70 | HR 73 | Temp 98.7°F | Wt 137.0 lb

## 2014-05-08 DIAGNOSIS — J0110 Acute frontal sinusitis, unspecified: Secondary | ICD-10-CM

## 2014-05-08 MED ORDER — AMOXICILLIN 500 MG PO TABS: 1000.0000 mg | ORAL_TABLET | Freq: Two times a day (BID) | ORAL | Status: DC

## 2014-05-08 NOTE — Progress Notes (Signed)
   Subjective:    Patient ID: Kayla Solis, female    DOB: 07/28/1980, 34 y.o.   MRN: [id number]  HPI Here due to sinus problems   Started about 10 days ago Lots of night drainage and coughing yellow stuff Not much nasal drainage but congested Sore throat Some clogged feeling in ears Frontal/temporal head pain  No fever Some SOB after coughing fits only  Tried sudafed sinus and alka seltzer mixed product---not clearly helpful  Current Outpatient Prescriptions on File Prior to Visit  Medication Sig Dispense Refill  . Prenatal Vit-Fe Fumarate-FA (PRENATAL MULTIVITAMIN) TABS Take 1 tablet by mouth at bedtime.      No current facility-administered medications on file prior to visit.    No Known Allergies  Past Medical History  Diagnosis Date  . No pertinent past medical history     Past Surgical History  Procedure Laterality Date  . Tonsillectomy    . Dilation and curettage of uterus    . Dilation and curettage of uterus N/A 03/01/2014    Procedure: DILATATION AND CURETTAGE;  Surgeon: Melina Schools, MD;  Location: [facility];  Service: Gynecology;  Laterality: N/A;    Family History  Problem Relation Age of Onset  . Cancer Mother   . Cancer Maternal Grandmother     History   Social History  . Marital Status: Married    Spouse Name: N/A    Number of Children: N/A  . Years of Education: N/A   Occupational History  . Not on file.   Social History Main Topics  . Smoking status: Never Smoker   . Smokeless tobacco: Not on file  . Alcohol Use: No  . Drug Use: No  . Sexual Activity: Yes    Birth Control/ Protection: None   Other Topics Concern  . Not on file   Social History Narrative   Review of Systems  No diarrhea Vomited once after hard cough---no other GI problems Appetite is okay     Objective:   Physical Exam  Constitutional: She appears well-nourished. No distress.  HENT:  Mouth/Throat: Oropharynx is clear and moist. No oropharyngeal  exudate.  No sinus tenderness TMs normal Moderate nasal congestion and inflammation  Neck: Normal range of motion. Neck supple.  Pulmonary/Chest: Breath sounds normal. No respiratory distress. She has no wheezes. She has no rales.  Lymphadenopathy:    She has no cervical adenopathy.          Assessment & Plan:

## 2014-05-08 NOTE — Assessment & Plan Note (Signed)
Seems bacterial  Will give amoxil Discussed supportive care

## 2014-05-08 NOTE — Progress Notes (Signed)
Pre visit review using our clinic review tool, if applicable. No additional management support is needed unless otherwise documented below in the visit note. 

## 2014-10-13 ENCOUNTER — Telehealth: Payer: Self-pay

## 2014-10-13 NOTE — Telephone Encounter (Signed)
Kayla Solis notified per our records her last Tdap at 09/30/2011 at [facility] Office.  Advised she is up to date and should be just fine to hold the new baby.

## 2014-10-13 NOTE — Telephone Encounter (Signed)
Pt left v/m; pts sister having baby and wants to verify up to date on tdap; immunization report has tdap given on 09/30/2011 at [facility] office.Please advise.pt request cb.

## 2014-10-13 NOTE — Telephone Encounter (Signed)
That's what we have in our vaccine record aas well.

## 2015-03-05 LAB — OB RESULTS CONSOLE GC/CHLAMYDIA
Chlamydia: NEGATIVE
GC PROBE AMP, GENITAL: NEGATIVE

## 2015-03-05 LAB — OB RESULTS CONSOLE HIV ANTIBODY (ROUTINE TESTING): HIV: NONREACTIVE

## 2015-03-05 LAB — OB RESULTS CONSOLE RPR: RPR: NONREACTIVE

## 2015-04-15 NOTE — L&D Delivery Note (Signed)
Delivery Note At 10:13 AM a viable female was delivered via Vaginal, Spontaneous Delivery (Presentation: Left Occiput Anterior).  APGAR: 9, 9; weight pending .   Placenta status: Intact, Spontaneous.  Cord: 3 vessels with the following complications: None.  Anesthesia: None  Episiotomy: None Lacerations: Perineal;2nd degree Suture Repair: 3.0 vicryl rapide Est. Blood Loss (mL):  325cc  Mom to postpartum.  Baby to Couplet care / Skin to Skin.  Logan Bores 09/27/2015, 10:52 AM

## 2015-08-01 ENCOUNTER — Encounter: Payer: Managed Care, Other (non HMO) | Attending: Obstetrics and Gynecology

## 2015-08-01 DIAGNOSIS — O24419 Gestational diabetes mellitus in pregnancy, unspecified control: Secondary | ICD-10-CM | POA: Diagnosis present

## 2015-08-01 DIAGNOSIS — Z3A00 Weeks of gestation of pregnancy not specified: Secondary | ICD-10-CM | POA: Insufficient documentation

## 2015-08-10 DIAGNOSIS — O24419 Gestational diabetes mellitus in pregnancy, unspecified control: Secondary | ICD-10-CM | POA: Insufficient documentation

## 2015-08-10 HISTORY — DX: Gestational diabetes mellitus in pregnancy, unspecified control: O24.419

## 2015-09-05 LAB — OB RESULTS CONSOLE GBS: GBS: NEGATIVE

## 2015-09-06 NOTE — Progress Notes (Signed)
  Patient was seen on 08/01/15 for Gestational Diabetes self-management class at the [facility]. The following learning objectives were met by the patient during this course:   States the definition of Gestational Diabetes  States why dietary management is important in controlling blood glucose  Describes the effects each nutrient has on blood glucose levels  Demonstrates ability to create a balanced meal plan  Demonstrates carbohydrate counting   States when to check blood glucose levels  Demonstrates proper blood glucose monitoring techniques  States the effect of stress and exercise on blood glucose levels  States the importance of limiting caffeine and abstaining from alcohol and smoking  Blood glucose monitor given: One Touch Verio Lot # [id number] Exp: 7/31`/18 Blood glucose reading: 82  Patient instructed to monitor glucose levels: FBS: 60 - <90 1 hour: <140 2 hour: <120  *Patient received handouts:  Nutrition Diabetes and Pregnancy  Carbohydrate Counting List  Patient will be seen for follow-up as needed.

## 2015-09-27 ENCOUNTER — Inpatient Hospital Stay (HOSPITAL_COMMUNITY)
Admission: AD | Admit: 2015-09-27 | Discharge: 2015-09-29 | DRG: 775 | Disposition: A | Discharge status: Home / Self Care | Payer: Managed Care, Other (non HMO) | Source: Ambulatory Visit | Attending: Obstetrics and Gynecology | Admitting: Obstetrics and Gynecology

## 2015-09-27 ENCOUNTER — Encounter (HOSPITAL_COMMUNITY): Payer: Self-pay | Admitting: *Deleted

## 2015-09-27 DIAGNOSIS — IMO0001 Reserved for inherently not codable concepts without codable children: Secondary | ICD-10-CM

## 2015-09-27 DIAGNOSIS — O24420 Gestational diabetes mellitus in childbirth, diet controlled: Principal | ICD-10-CM | POA: Diagnosis present

## 2015-09-27 DIAGNOSIS — Z3A38 38 weeks gestation of pregnancy: Secondary | ICD-10-CM | POA: Diagnosis not present

## 2015-09-27 LAB — TYPE AND SCREEN
ABO/RH(D): A POS
Antibody Screen: NEGATIVE

## 2015-09-27 LAB — CBC
HEMATOCRIT: 41.2 % (ref 36.0–46.0)
Hemoglobin: 13.8 g/dL (ref 12.0–15.0)
MCH: 25.9 pg — AB (ref 26.0–34.0)
MCHC: 33.5 g/dL (ref 30.0–36.0)
MCV: 77.3 fL — AB (ref 78.0–100.0)
Platelets: 208 10*3/uL (ref 150–400)
RBC: 5.33 MIL/uL — ABNORMAL HIGH (ref 3.87–5.11)
RDW: 18 % — AB (ref 11.5–15.5)
WBC: 19.8 10*3/uL — ABNORMAL HIGH (ref 4.0–10.5)

## 2015-09-27 MED ORDER — DIBUCAINE 1 % RE OINT: 1.0000 "application " | TOPICAL_OINTMENT | RECTAL | Status: DC | PRN

## 2015-09-27 MED ORDER — IBUPROFEN 600 MG PO TABS
600.0000 mg | ORAL_TABLET | Freq: Four times a day (QID) | ORAL | Status: DC
  Administered 2015-09-27 – 2015-09-29 (×7): 600 mg via ORAL
  Filled 2015-09-27 (×7): qty 1

## 2015-09-27 MED ORDER — BUTORPHANOL TARTRATE 1 MG/ML IJ SOLN
INTRAMUSCULAR | Status: AC
  Filled 2015-09-27: qty 1

## 2015-09-27 MED ORDER — OXYCODONE-ACETAMINOPHEN 5-325 MG PO TABS: 2.0000 | ORAL_TABLET | ORAL | Status: DC | PRN

## 2015-09-27 MED ORDER — WITCH HAZEL-GLYCERIN EX PADS: 1.0000 "application " | MEDICATED_PAD | CUTANEOUS | Status: DC | PRN

## 2015-09-27 MED ORDER — ONDANSETRON HCL 4 MG PO TABS: 4.0000 mg | ORAL_TABLET | ORAL | Status: DC | PRN

## 2015-09-27 MED ORDER — TETANUS-DIPHTH-ACELL PERTUSSIS 5-2.5-18.5 LF-MCG/0.5 IM SUSP: 0.5000 mL | Freq: Once | INTRAMUSCULAR | Status: DC

## 2015-09-27 MED ORDER — BENZOCAINE-MENTHOL 20-0.5 % EX AERO
1.0000 "application " | INHALATION_SPRAY | CUTANEOUS | Status: DC | PRN
  Administered 2015-09-27: 1 via TOPICAL
  Filled 2015-09-27: qty 56

## 2015-09-27 MED ORDER — OXYCODONE-ACETAMINOPHEN 5-325 MG PO TABS: 1.0000 | ORAL_TABLET | ORAL | Status: DC | PRN

## 2015-09-27 MED ORDER — OXYTOCIN 40 UNITS IN LACTATED RINGERS INFUSION - SIMPLE MED
2.5000 [IU]/h | INTRAVENOUS | Status: DC
  Filled 2015-09-27: qty 1000

## 2015-09-27 MED ORDER — ONDANSETRON HCL 4 MG/2ML IJ SOLN: 4.0000 mg | INTRAMUSCULAR | Status: DC | PRN

## 2015-09-27 MED ORDER — ACETAMINOPHEN 325 MG PO TABS: 650.0000 mg | ORAL_TABLET | ORAL | Status: DC | PRN

## 2015-09-27 MED ORDER — OXYTOCIN BOLUS FROM INFUSION
500.0000 mL | INTRAVENOUS | Status: DC
  Administered 2015-09-27: 500 mL via INTRAVENOUS

## 2015-09-27 MED ORDER — ZOLPIDEM TARTRATE 5 MG PO TABS: 5.0000 mg | ORAL_TABLET | Freq: Every evening | ORAL | Status: DC | PRN

## 2015-09-27 MED ORDER — SENNOSIDES-DOCUSATE SODIUM 8.6-50 MG PO TABS
2.0000 | ORAL_TABLET | ORAL | Status: DC
  Administered 2015-09-28 (×2): 2 via ORAL
  Filled 2015-09-27 (×2): qty 2

## 2015-09-27 MED ORDER — BUTORPHANOL TARTRATE 1 MG/ML IJ SOLN
1.0000 mg | INTRAMUSCULAR | Status: DC | PRN
  Administered 2015-09-27: 1 mg via INTRAVENOUS

## 2015-09-27 MED ORDER — ONDANSETRON HCL 4 MG/2ML IJ SOLN: 4.0000 mg | Freq: Four times a day (QID) | INTRAMUSCULAR | Status: DC | PRN

## 2015-09-27 MED ORDER — LACTATED RINGERS IV SOLN
INTRAVENOUS | Status: DC
  Administered 2015-09-27: 10:00:00 via INTRAVENOUS

## 2015-09-27 MED ORDER — PRENATAL MULTIVITAMIN CH
1.0000 | ORAL_TABLET | Freq: Every day | ORAL | Status: DC
  Administered 2015-09-27 – 2015-09-28 (×2): 1 via ORAL
  Filled 2015-09-27 (×2): qty 1

## 2015-09-27 MED ORDER — SIMETHICONE 80 MG PO CHEW: 80.0000 mg | CHEWABLE_TABLET | ORAL | Status: DC | PRN

## 2015-09-27 MED ORDER — LACTATED RINGERS IV SOLN: 500.0000 mL | INTRAVENOUS | Status: DC | PRN

## 2015-09-27 MED ORDER — DIPHENHYDRAMINE HCL 25 MG PO CAPS: 25.0000 mg | ORAL_CAPSULE | Freq: Four times a day (QID) | ORAL | Status: DC | PRN

## 2015-09-27 MED ORDER — SOD CITRATE-CITRIC ACID 500-334 MG/5ML PO SOLN: 30.0000 mL | ORAL | Status: DC | PRN

## 2015-09-27 MED ORDER — COCONUT OIL OIL: 1.0000 "application " | TOPICAL_OIL | Status: DC | PRN

## 2015-09-27 MED ORDER — LIDOCAINE HCL (PF) 1 % IJ SOLN
30.0000 mL | INTRAMUSCULAR | Status: AC | PRN
  Administered 2015-09-27: 30 mL via SUBCUTANEOUS
  Filled 2015-09-27: qty 30

## 2015-09-27 NOTE — MAU Note (Signed)
Pt C/O uc's since 0400 this morning, has small amount of bleeding, denies LOF.

## 2015-09-27 NOTE — Progress Notes (Signed)
Contact with Dr. Marvel Plan. Dr. Arville Go aware of fetal heart rate tracing and contraction pattern as well as vaginal exam. Instructed to offer patient to be augmented or to ambulate and the re-evaluate. Patient opts to ambulate. Pt off monitor and instructed to return to room in 2 hours. Toya Smothers, RN

## 2015-09-27 NOTE — MAU Note (Signed)
Patient received to RM 8 c/o contractions that are 3 min apart since 0600 with small amount of bloody show. Denies that water is broken. EFM applied. FHR 130. Pain is 8/10 and denies the need for pain medication. Will contact provider. [place]

## 2015-09-27 NOTE — Lactation Note (Signed)
This note was copied from a baby's chart. Lactation Consultation Note  Initial visit made.  Breastfeeding consultation services and support information given and reviewed.  This is mom's second baby and she nursed her first for two years.  Newborn is 72 hours old and has been to the breast and nursed well per mom.  Reviewed feeding with any cues and to call for assist prn.  Breast massage encouraged during feeding to enhance milk flow and intake.  Patient Name: Girl Deazia Poncedeleon [id number] Date: 09/27/2015 Reason for consult: Initial assessment   Maternal Data Does the patient have breastfeeding experience prior to this delivery?: Yes  Feeding Feeding Type: Breast Fed Length of feed: 10 min  LATCH Score/Interventions Latch: Grasps breast easily, tongue down, lips flanged, rhythmical sucking.  Audible Swallowing: None Intervention(s): Skin to skin  Type of Nipple: Everted at rest and after stimulation  Comfort (Breast/Nipple): Soft / non-tender     Hold (Positioning): Assistance needed to correctly position infant at breast and maintain latch. Intervention(s): Breastfeeding basics reviewed;Skin to skin  LATCH Score: 7  Lactation Tools Discussed/Used     Consult Status Consult Status: PRN    Ave Filter 09/27/2015, 4:15 PM

## 2015-09-27 NOTE — H&P (Signed)
Kayla Solis is a 35 y.o. female G$P1021 at 71 6/7 weeks (EDD 10/05/15 by LMP c/w 8 week [place])  presenting for regular strong contractions.  Pt presented to MAU 80/2-3/-2 and change rapidly to 9cm then admitted for delivery. Prenatal care complicated by GDM diet controlled and AMA but declined genetic screening.    Maternal Medical History:  Reason for admission: Contractions.   Contractions: Onset was 3-5 hours ago.   Frequency: regular.   Perceived severity is strong.    Fetal activity: Perceived fetal activity is normal.    Prenatal Complications - Diabetes: gestational. Diabetes is managed by diet.      OB History    Gravida Para Term Preterm AB TAB SAB Ectopic Multiple Living   3 1 1  0 1 0 1 0 0 1    SAB x 2 NSVD 2013 8#9oz  Past Medical History  Diagnosis Date  . No pertinent past medical history    Past Surgical History  Procedure Laterality Date  . Tonsillectomy    . Dilation and curettage of uterus    . Dilation and curettage of uterus N/A 03/01/2014    Procedure: DILATATION AND CURETTAGE;  Surgeon: Melina Schools, MD;  Location: [facility];  Service: Gynecology;  Laterality: N/A;   Family History: family history includes Cancer in her maternal grandmother and mother. Social History:  reports that she has never smoked. She does not have any smokeless tobacco history on file. She reports that she does not drink alcohol or use illicit drugs.   Prenatal Transfer Tool  Maternal Diabetes: Yes:  Diabetes Type:  Diet controlled Genetic Screening: Declined Maternal Ultrasounds/Referrals: Normal Fetal Ultrasounds or other Referrals:  None Maternal Substance Abuse:  No Significant Maternal Medications:  None Significant Maternal Lab Results:  None Other Comments:  None  Review of Systems  Gastrointestinal: Positive for abdominal pain.    Dilation: 10 Effacement (%): 100 Station: +2 Exam by:: Dr. Marvel Plan Blood pressure 131/73, pulse 60, temperature 98.2 F (36.8  C), temperature source Oral, resp. rate 18, height 5\' 2"  (1.575 m), weight 170 lb (77.111 kg), SpO2 99 %. Maternal Exam:  Uterine Assessment: Contraction strength is mild.  Contraction frequency is regular.   Abdomen: Fetal presentation: vertex  Introitus: Normal vulva. Normal vagina.    Physical Exam  Constitutional: She appears well-developed and well-nourished.  Cardiovascular: Normal rate.   Respiratory: Effort normal.  GI: Soft.  Genitourinary: Vagina normal.  Neurological: She is alert.  Psychiatric: She has a normal mood and affect.    Prenatal labs: ABO, Rh:  A positive Antibody:  negative Rubella:  Immune RPR: Nonreactive (11/21 0000)  HBsAg:   Negative HIV: Non-reactive (11/21 0000)  GBS: Negative (05/24 0000)  One hour GCT 166 3 hour GTT 88/188/169/140  Assessment/Plan: Pt admitted with precipitous labor and delivered on arrival to L&D, see delivery note.   Logan Bores 09/27/2015, 10:44 AM

## 2015-09-28 LAB — CBC
HEMATOCRIT: 32.7 % — AB (ref 36.0–46.0)
HEMOGLOBIN: 10.5 g/dL — AB (ref 12.0–15.0)
MCH: 25.3 pg — AB (ref 26.0–34.0)
MCHC: 32.1 g/dL (ref 30.0–36.0)
MCV: 78.8 fL (ref 78.0–100.0)
Platelets: 172 10*3/uL (ref 150–400)
RBC: 4.15 MIL/uL (ref 3.87–5.11)
RDW: 18 % — ABNORMAL HIGH (ref 11.5–15.5)
WBC: 14.5 10*3/uL — ABNORMAL HIGH (ref 4.0–10.5)

## 2015-09-28 LAB — RPR: RPR: NONREACTIVE

## 2015-09-28 NOTE — Progress Notes (Signed)
Post Partum Day 1 Subjective: no complaints and tolerating PO  Objective: Blood pressure 111/78, pulse 69, temperature 97.8 F (36.6 C), temperature source Oral, resp. rate 18, height 5\' 2"  (1.575 m), weight 170 lb (77.111 kg), SpO2 99 %, unknown if currently breastfeeding.  Physical Exam:  General: alert and cooperative Lochia: appropriate Uterine Fundus: firm   Recent Labs  09/27/15 0945 09/28/15 0622  HGB 13.8 10.5*  HCT 41.2 32.7*    Assessment/Plan: Plan for discharge tomorrow   LOS: 1 day   Kayla Solis W 09/28/2015, 8:42 AM

## 2015-09-28 NOTE — Lactation Note (Signed)
This note was copied from a baby's chart. Lactation Consultation Note Follow up visit at 59 hours of age.  Baby has had 10 feedings in past 24 hours with 1 void and 2 stools, last void was at 0400 >18 hours ago.  Mom confirms no additional voids.  Baby new tip of nipple at end of feeding when Lc arrived.  Encouraged mom to break latch and remove baby.  Mom has large long nipples.  Left nipple has compression stripe with bruising noted.  Mom instructed on how to hand express to get drops of colostrum before latch and after feedings to apply to nipple.  Mom has soft breast with minimal glandular tissue assessed.  Mom does report breastfeeding an older child.  Baby is latching shallow with pulling and twisting when attempting to get deep latch.  Attempted several position changes and baby got tired out and is now asleep.  Encouraged mom to work on hand expression, deep latch and call RN to observe for swallows.  [place] unsure how well baby is transferring at the breast.      Patient Name: [name] [id number] Date: 09/28/2015 Reason for consult: Follow-up assessment   Maternal Data    Feeding Feeding Type: Breast Fed Length of feed: 60 min  LATCH Score/Interventions Latch: Repeated attempts needed to sustain latch, nipple held in mouth throughout feeding, stimulation needed to elicit sucking reflex. Intervention(s): Adjust position;Assist with latch;Breast massage;Breast compression  Audible Swallowing: None  Type of Nipple: Everted at rest and after stimulation  Comfort (Breast/Nipple): Soft / non-tender     Hold (Positioning): Assistance needed to correctly position infant at breast and maintain latch. Intervention(s): Breastfeeding basics reviewed;Support Pillows;Position options;Skin to skin  LATCH Score: 6  Lactation Tools Discussed/Used     Consult Status Consult Status: Follow-up Date: 09/29/15 Follow-up type: In-patient    Justice Britain 09/28/2015, 10:15 PM

## 2015-09-28 NOTE — Progress Notes (Addendum)
Post Partum Day 2 Subjective: no complaints, up ad lib, voiding, tolerating PO and nl lochia, pain controlled  Objective: Blood pressure 111/78, pulse 69, temperature 97.8 F (36.6 C), temperature source Oral, resp. rate 18, height 5\' 2"  (1.575 m), weight 77.111 kg (170 lb), SpO2 99 %, unknown if currently breastfeeding.  Physical Exam:  General: alert and no distress Lochia: appropriate Uterine Fundus: firm   Recent Labs  09/27/15 0945 09/28/15 0622  HGB 13.8 10.5*  HCT 41.2 32.7*    Assessment/Plan: Plan for discharge  today if cleared by nursery.   Breastfeeding and Lactation consult.  Routine care.  D/C with motrin and PNV   LOS: 1 day   [place]uck[place], Alexandra Lipps

## 2015-09-28 NOTE — Progress Notes (Signed)
MOB was referred for history of depression/anxiety. * Referral screened out by Clinical Social Worker because none of the following criteria appear to apply: ~ History of anxiety/depression during this pregnancy, or of post-partum depression. ~ Diagnosis of anxiety and/or depression within last 3 years OR * MOB's symptoms currently being treated with medication and/or therapy. Please contact the Clinical Social Worker if needs arise, or if MOB requests.  PNR states history of Depression in "2010-counseling, resolved."

## 2015-09-29 MED ORDER — PRENATAL MULTIVITAMIN CH: 1.0000 | ORAL_TABLET | Freq: Every day | ORAL | Status: DC

## 2015-09-29 MED ORDER — IBUPROFEN 600 MG PO TABS: 600.0000 mg | ORAL_TABLET | Freq: Four times a day (QID) | ORAL | Status: DC

## 2015-09-29 NOTE — Lactation Note (Signed)
This note was copied from a baby's chart. Lactation Consultation Note  Mom states that baby latches well after a few attempts but falls asleep easily.  She is post pumping a spoon feeding baby.  Reviewed waking techniques, breast massage, giving baby a few mls by spoon first and continuing to post pump and give expressed milk back to baby if poor or sleepy feeding.  Engorgement instructions reviewed.   Answered questions.  Outpatient lactation services and support reviewed and encouraged.  Patient Name: Kayla Solis [id number] Date: 09/29/2015     Maternal Data    Feeding    LATCH Score/Interventions                      Lactation Tools Discussed/Used     Consult Status      Ave Filter 09/29/2015, 10:12 AM

## 2015-09-29 NOTE — Discharge Summary (Signed)
OB Discharge Summary     Patient Name: Kayla Solis DOB: 29-Jul-1980 MRN: [id number]  Date of admission: 09/27/2015 Delivering MD: Paula Compton   Date of discharge: 09/29/2015  Admitting diagnosis: CTX Intrauterine pregnancy: [redacted]w[redacted]d     Secondary diagnosis:  Active Problems:   Active labor   Precipitous delivery  Additional problems: AMA     Discharge diagnosis: Term Pregnancy Delivered                                                                                                Post partum procedures:N/A  Augmentation: None  Complications: None  Hospital course:  Onset of Labor With Vaginal Delivery     35 y.o. yo [id number] at [redacted]w[redacted]d was admitted in Active Labor on 09/27/2015. Patient had an uncomplicated labor course as follows:  Membrane Rupture Time/Date: 10:00 AM ,09/27/2015   Intrapartum Procedures: Episiotomy: None [1]                                         Lacerations:  Perineal [11];2nd degree [3]  Patient had a delivery of a Viable infant. 09/27/2015  Information for the patient's newborn:  Kayla, Solis [id number]       Pateint had an uncomplicated postpartum course.  She is ambulating, tolerating a regular diet, passing flatus, and urinating well. Patient is discharged home in stable condition on 09/29/2015.    Physical exam  Filed Vitals:   09/27/15 1215 09/27/15 1355 09/28/15 0600 09/28/15 1753  BP: 124/66 119/76 111/78 117/64  Pulse: 70 81 69 67  Temp: 97.9 F (36.6 C) 98.8 F (37.1 C) 97.8 F (36.6 C) 98 F (36.7 C)  TempSrc: Oral Oral Oral Oral  Resp: 18 18 18 18   Height:      Weight:      SpO2:       General: alert and no distress Lochia: appropriate Uterine Fundus: firm  Labs: Lab Results  Component Value Date   WBC 14.5* 09/28/2015   HGB 10.5* 09/28/2015   HCT 32.7* 09/28/2015   MCV 78.8 09/28/2015   PLT 172 09/28/2015   CMP Latest Ref Rng 03/01/2014  Glucose 70 - 99 mg/dL 86  BUN 6 - 23 mg/dL 7  Creatinine 0.50 - 1.10  mg/dL 0.52  Sodium 137 - 147 mEq/L 136(L)  Potassium 3.7 - 5.3 mEq/L 3.5(L)  Chloride 96 - 112 mEq/L 101  CO2 19 - 32 mEq/L 25  Calcium 8.4 - 10.5 mg/dL 9.1  Total Protein 6.0 - 8.3 g/dL 7.3  Total Bilirubin 0.3 - 1.2 mg/dL 0.7  Alkaline Phos 39 - 117 U/L 35(L)  AST 0 - 37 U/L 21  ALT 0 - 35 U/L 27    Discharge instruction: per After Visit Summary and "Baby and Me Booklet".  After visit meds:    Medication List    STOP taking these medications        amoxicillin 500 MG tablet  Commonly known as:  AMOXIL  TAKE these medications        ferrous sulfate 325 (65 FE) MG tablet  Take 325 mg by mouth daily with breakfast.     ibuprofen 600 MG tablet  Commonly known as:  ADVIL,MOTRIN  Take 1 tablet (600 mg total) by mouth every 6 (six) hours.     prenatal multivitamin Tabs tablet  Take 1 tablet by mouth at bedtime.        Diet: routine diet  Activity: Advance as tolerated. Pelvic rest for 6 weeks.   Outpatient follow up:6 weeks Follow up Appt:No future appointments. Follow up Visit:No Follow-up on file.  Postpartum contraception: Undecided  Newborn Data: Live born female  Birth Weight: 7 lb 1.1 oz (3205 g) APGAR: 9, 9  Baby Feeding: Breast Disposition:home with mother   09/29/2015 Janyth Contes, MD

## 2016-04-14 HISTORY — PX: COLONOSCOPY: SHX174

## 2016-07-22 ENCOUNTER — Ambulatory Visit: Payer: PRIVATE HEALTH INSURANCE | Admitting: Family Medicine

## 2016-07-25 ENCOUNTER — Telehealth: Payer: Self-pay | Admitting: *Deleted

## 2016-07-25 ENCOUNTER — Encounter: Payer: Self-pay | Admitting: *Deleted

## 2016-07-25 NOTE — Telephone Encounter (Signed)
PreVisit Call completed. Pt would like referral to dermatology. Would also like to discuss preventative measures for breast cancer screenings as her mother died from breast cancer. No acute complaints.

## 2016-07-28 ENCOUNTER — Ambulatory Visit (INDEPENDENT_AMBULATORY_CARE_PROVIDER_SITE_OTHER): Payer: Commercial Managed Care - HMO | Admitting: Family Medicine

## 2016-07-28 ENCOUNTER — Encounter: Payer: Self-pay | Admitting: Family Medicine

## 2016-07-28 VITALS — BP 110/68 | HR 77 | Temp 98.4°F | Ht 62.0 in | Wt 137.4 lb

## 2016-07-28 DIAGNOSIS — Z803 Family history of malignant neoplasm of breast: Secondary | ICD-10-CM

## 2016-07-28 DIAGNOSIS — J301 Allergic rhinitis due to pollen: Secondary | ICD-10-CM

## 2016-07-28 DIAGNOSIS — D509 Iron deficiency anemia, unspecified: Secondary | ICD-10-CM

## 2016-07-28 DIAGNOSIS — Z8632 Personal history of gestational diabetes: Secondary | ICD-10-CM

## 2016-07-28 HISTORY — DX: Family history of malignant neoplasm of breast: Z80.3

## 2016-07-28 HISTORY — DX: Iron deficiency anemia, unspecified: D50.9

## 2016-07-28 LAB — CBC WITH DIFFERENTIAL/PLATELET
Basophils Absolute: 0.1 10*3/uL (ref 0.0–0.1)
Basophils Relative: 1 % (ref 0.0–3.0)
Eosinophils Absolute: 0.5 10*3/uL (ref 0.0–0.7)
Eosinophils Relative: 6.7 % — ABNORMAL HIGH (ref 0.0–5.0)
HCT: 38.9 % (ref 36.0–46.0)
Hemoglobin: 12.9 g/dL (ref 12.0–15.0)
Lymphocytes Relative: 25.3 % (ref 12.0–46.0)
Lymphs Abs: 2 10*3/uL (ref 0.7–4.0)
MCHC: 33.1 g/dL (ref 30.0–36.0)
MCV: 82.8 fl (ref 78.0–100.0)
Monocytes Absolute: 1.1 10*3/uL — ABNORMAL HIGH (ref 0.1–1.0)
Monocytes Relative: 14.2 % — ABNORMAL HIGH (ref 3.0–12.0)
Neutro Abs: 4.2 10*3/uL (ref 1.4–7.7)
Neutrophils Relative %: 52.8 % (ref 43.0–77.0)
Platelets: 208 10*3/uL (ref 150.0–400.0)
RBC: 4.7 Mil/uL (ref 3.87–5.11)
RDW: 13.1 % (ref 11.5–15.5)
WBC: 7.9 10*3/uL (ref 4.0–10.5)

## 2016-07-28 LAB — HEMOGLOBIN A1C: Hgb A1c MFr Bld: 5.2 % (ref 4.6–6.5)

## 2016-07-28 MED ORDER — FLUTICASONE PROPIONATE 50 MCG/ACT NA SUSP: 2.0000 | Freq: Every day | NASAL | 6 refills | Status: DC

## 2016-07-28 NOTE — Progress Notes (Signed)
Kayla Solis is a 36 y.o. female is here to [facility].   History of Present Illness:   Shaune Pascal CMA acting as scribe for Dr. Juleen China.  HPI:  1. Seasonal allergic rhinitis. Not sure what to take due to breastfeeding. Hx of severe allergies. Was previously seen by an Allergist.     2. Family history of breast cancer in mother. Mom died at age 59, with advanced metastatic breast cancer. Unknown diagnosis age. No FamHx of ovarian cancer. Maternal grandmother with Hx of colon cancer, unknown age at diagnosis. Mom was smoker. Unknown if she breast fed. Patient has sister, 59 years older without issues. Maternal aunts without issues.     3. History of gestational diabetes. Diet controlled. Endocrine ROS: negative for - malaise/lethargy or polydipsia/polyuria.    4. Iron deficiency anemia. On iron supplementation. Endocrine ROS: negative for - palpitations or temperature intolerance.   Health Maintenance Due  Topic Date Due  . PAP SMEAR  12/13/2012   PMHx, SurgHx, SocialHx, Medications, and Allergies were reviewed in the Visit Navigator and updated as appropriate.   Past Medical History:  Diagnosis Date  . Allergic rhinitis   . Asthma   . Depression with anxiety   . Family history of breast cancer in mother 07/28/2016  . History of gestational diabetes 07/28/2016  . Iron deficiency anemia 07/28/2016   Past Surgical History:  Procedure Laterality Date  . DILATION AND CURETTAGE OF UTERUS N/A 03/01/2014  . TONSILLECTOMY     Family History  Problem Relation Age of Onset  . Breast cancer Mother   . Colon cancer Maternal Grandmother    Social History  Substance Use Topics  . Smoking status: Never Smoker  . Smokeless tobacco: Never Used  . Alcohol use No   Current Medications and Allergies:   .  ferrous sulfate 325 (65 FE) MG tablet, Take 325 mg by mouth daily with breakfast., Disp: , Rfl:   No Known Allergies   Review of Systems:   Review of Systems    Constitutional: Negative for chills, fever and malaise/fatigue.  HENT: Positive for congestion. Negative for ear pain, sinus pain and sore throat.   Eyes: Negative for blurred vision and double vision.  Respiratory: Positive for cough. Negative for shortness of breath and wheezing.   Cardiovascular: Negative for chest pain, palpitations and leg swelling.  Gastrointestinal: Negative for abdominal pain, constipation, diarrhea and vomiting.  Genitourinary: Negative for dysuria.  Musculoskeletal: Negative for back pain, joint pain and neck pain.  Skin: Negative for itching and rash.  Neurological: Negative for dizziness and headaches.  Psychiatric/Behavioral: Negative for depression, hallucinations and memory loss.    Vitals:   Vitals:   07/28/16 0746  BP: 110/68  Pulse: 77  Temp: 98.4 F (36.9 C)  TempSrc: Oral  SpO2: 98%  Weight: 137 lb 6.4 oz (62.3 kg)  Height: 5\' 2"  (1.575 m)     Body mass index is 25.13 kg/m.  Physical Exam:   Physical Exam  Constitutional: She appears well-developed and well-nourished. No distress.  HENT:  Head: Normocephalic and atraumatic.  Right Ear: External ear normal.  Left Ear: External ear normal.  Nose: Rhinorrhea present.  Eyes: EOM are normal. Pupils are equal, round, and reactive to light.  Neck: Normal range of motion. Neck supple.  Cardiovascular: Normal rate, regular rhythm, normal heart sounds and intact distal pulses.   Pulmonary/Chest: Effort normal.  Abdominal: Soft.  Skin: Skin is warm.  Psychiatric: She has a normal mood  and affect. Her behavior is normal.  Nursing note and vitals reviewed.  Results for orders placed or performed in visit on 07/28/16  Hemoglobin A1c  Result Value Ref Range   Hgb A1c MFr Bld 5.2 4.6 - 6.5 %  CBC with Differential/Platelet  Result Value Ref Range   WBC 7.9 4.0 - 10.5 K/uL   RBC 4.70 3.87 - 5.11 Mil/uL   Hemoglobin 12.9 12.0 - 15.0 g/dL   HCT 38.9 36.0 - 46.0 %   MCV 82.8 78.0 - 100.0 fl    MCHC 33.1 30.0 - 36.0 g/dL   RDW 13.1 11.5 - 15.5 %   Platelets 208.0 150.0 - 400.0 K/uL   Neutrophils Relative % 52.8 43.0 - 77.0 %   Lymphocytes Relative 25.3 12.0 - 46.0 %   Monocytes Relative 14.2 (H) 3.0 - 12.0 %   Eosinophils Relative 6.7 (H) 0.0 - 5.0 %   Basophils Relative 1.0 0.0 - 3.0 %   Neutro Abs 4.2 1.4 - 7.7 K/uL   Lymphs Abs 2.0 0.7 - 4.0 K/uL   Monocytes Absolute 1.1 (H) 0.1 - 1.0 K/uL   Eosinophils Absolute 0.5 0.0 - 0.7 K/uL   Basophils Absolute 0.1 0.0 - 0.1 K/uL   Assessment and Plan:    Ruthie was seen today for establish care.  Diagnoses and all orders for this visit:  Seasonal allergic rhinitis due to pollen Comments: Flonase is category B. Orders: -     fluticasone (FLONASE) 50 MCG/ACT nasal spray; Place 2 sprays into both nostrils daily.  Family history of breast cancer in mother Comments: Patient currently breastfeeding. Goal to 1 year. Infant is 10 months. Will start screening at age 3, possiby sooner if guidelines indicate.  History of gestational diabetes -     Hemoglobin A1c  Iron deficiency anemia, unspecified iron deficiency anemia type Comments: Resolved. Orders: -     CBC with Differential/Platelet   . Reviewed expectations re: course of current medical issues. . Discussed self-management of symptoms. . Outlined signs and symptoms indicating need for more acute intervention. . Patient verbalized understanding and all questions were answered. . See orders for this visit as documented in the electronic medical record. . Patient received an After Visit Summary.  Records requested if needed. I spent 30 minutes with this patient, greater than 50% was face-to-face time counseling regarding the above diagnoses.  CMA served as Education administrator during this visit. History, Physical, and Plan performed by medical provider. Documentation and orders reviewed and attested to. Briscoe Deutscher, D.O.  Briscoe Deutscher, [place], [place] 07/28/2016   Follow-up: Return in about 6 months (around 01/27/2017).  Meds ordered this encounter  Medications  . fluticasone (FLONASE) 50 MCG/ACT nasal spray    Sig: Place 2 sprays into both nostrils daily.    Dispense:  16 g    Refill:  6   There are no discontinued medications. Orders Placed This Encounter  Procedures  . Hemoglobin A1c  . CBC with Differential/Platelet

## 2016-07-28 NOTE — Progress Notes (Signed)
Pre visit review using our clinic review tool, if applicable. No additional management support is needed unless otherwise documented below in the visit note. 

## 2016-08-01 ENCOUNTER — Ambulatory Visit (INDEPENDENT_AMBULATORY_CARE_PROVIDER_SITE_OTHER): Payer: Commercial Managed Care - HMO | Admitting: Family Medicine

## 2016-08-01 ENCOUNTER — Encounter: Payer: Self-pay | Admitting: Family Medicine

## 2016-08-01 ENCOUNTER — Telehealth: Payer: Commercial Managed Care - HMO | Admitting: Family

## 2016-08-01 VITALS — BP 120/78 | HR 74 | Temp 98.1°F | Wt 138.0 lb

## 2016-08-01 DIAGNOSIS — H5710 Ocular pain, unspecified eye: Secondary | ICD-10-CM

## 2016-08-01 DIAGNOSIS — H1011 Acute atopic conjunctivitis, right eye: Secondary | ICD-10-CM

## 2016-08-01 NOTE — Patient Instructions (Signed)
I did not see any foreign body in your eye Please get an over the counter antihistamine eye drops and use according to the package directions For comfort, you can also use lubricating eye drops If not better in 24 hours or if you have more pain, please go to ER or Urgent Care  Allergic Conjunctivitis, Adult Allergic conjunctivitis is inflammation of the clear membrane that covers the white part of your eye and the inner surface of your eyelid (conjunctiva). The inflammation is caused by allergies. The blood vessels in the conjunctiva become inflamed and this causes the eyes to become red or pink. The eyes often feel itchy. Allergic conjunctivitis cannot be spread from one person to another person (is not contagious). What are the causes? This condition is caused by an allergic reaction. Common causes of an allergic reaction (allergens) include:  Outdoor allergens, such as:  Pollen.  Grass and weeds.  Mold spores.  Indoor allergens, such as:  Dust.  Smoke.  Mold.  Pet dander.  Animal hair. What increases the risk? You may be more likely to develop this condition if you have a family history of allergies, such as:  Allergic rhinitis.  Bronchial asthma.  Atopic dermatitis. What are the signs or symptoms? Symptoms of this condition include eyes that are:  Itchy.  Red.  Watery.  Puffy. Your eyes may also:  Sting or burn.  Have clear drainage coming from them. How is this diagnosed? This condition may be diagnosed by medical history and physical exam. If you have drainage from your eyes, it may be tested to rule out other causes of conjunctivitis. You may also need to see a health care provider who specializes in treating allergies (allergist) or eye conditions (ophthalmologist) for tests to confirm the diagnosis. You may have:  Skin tests to see which allergens are causing your symptoms. These tests involve pricking the skin with a tiny needle and exposing the skin  to small amounts of potential allergens to see if your skin reacts.  Blood tests.  Tissue scrapings from your eyelid. These will be examined under a microscope. How is this treated? Treatments for this condition may include:  Cold cloths (compresses) to soothe itching and swelling.  Washing the face to remove allergens.  Eye drops. These may be prescription or over-the-counter. There are several different types. You may need to try different types to see which one works best for you. Your may need:  Eye drops that block the allergic reaction (antihistamine).  Eye drops that reduce swelling and irritation (anti-inflammatory).  Steroid eye drops to lessen a severe reaction (vernal conjunctivitis).  Oral antihistamine medicines to reduce your allergic reaction. You may need these if eye drops do not help or are difficult to use. Follow these instructions at home:  Avoid known allergens whenever possible.  Take or apply over-the-counter and prescription medicines only as told by your health care provider. These include any eye drops.  Apply a cool, clean washcloth to your eye for 10-20 minutes, 3-4 times a day.  Do not touch or rub your eyes.  Do not wear contact lenses until the inflammation is gone. Wear glasses instead.  Do not wear eye makeup until the inflammation is gone.  Keep all follow-up visits as told by your health care provider. This is important. Contact a health care provider if:  Your symptoms get worse or do not improve with treatment.  You have mild eye pain.  You have sensitivity to light.  You have  spots or blisters on your eyes.  You have pus draining from your eye.  You have a fever. Get help right away if:  You have redness, swelling, or other symptoms in only one eye.  Your vision is blurred or you have vision changes.  You have severe eye pain. This information is not intended to replace advice given to you by your health care provider.  Make sure you discuss any questions you have with your health care provider. Document Released: 06/21/2002 Document Revised: 11/28/2015 Document Reviewed: 10/12/2015 Elsevier Interactive Patient Education  2017 [employer].

## 2016-08-01 NOTE — Progress Notes (Signed)
   Subjective:    Patient ID: Kayla Solis, female    DOB: Nov 18, 1980, 36 y.o.   MRN: [id number]  HPI This is a 36 yo female who presents today with pain and swelling of right eye. Noticed that yesterday evening felt like she had a foreign body, was unable to see anything in eye, felt gritty. Awoke this morning and eye felt sore but not painful, some matting this morning, some watery drainage this afternoon.  No headache, no visual changes. No light sensitivity, no blurred or double vision. No headache.  Was seen 4 days ago by PCP, Juleen China for seasonal allergic rhinitis, was given flonase which she has started.   Past Medical History:  Diagnosis Date  . Allergic rhinitis   . Asthma   . Depression with anxiety   . Family history of breast cancer in mother 07/28/2016  . History of gestational diabetes 07/28/2016  . Iron deficiency anemia 07/28/2016   Past Surgical History:  Procedure Laterality Date  . DILATION AND CURETTAGE OF UTERUS N/A 03/01/2014   Procedure: DILATATION AND CURETTAGE;  Surgeon: Melina Schools, MD;  Location: [facility];  Service: Gynecology;  Laterality: N/A;  . TONSILLECTOMY     Family History  Problem Relation Age of Onset  . Breast cancer Mother   . Colon cancer Maternal Grandmother    Social History  Substance Use Topics  . Smoking status: Never Smoker  . Smokeless tobacco: Never Used  . Alcohol use No      Review of Systems Per HPI    Objective:   Physical Exam  Constitutional: She is oriented to person, place, and time. She appears well-developed and well-nourished. No distress.  HENT:  Head: Normocephalic and atraumatic.  Eyes: EOM are normal. Pupils are equal, round, and reactive to light. Right eye exhibits discharge (small amount watery. ). Right eye exhibits no chemosis, no exudate and no hordeolum. No foreign body present in the right eye. Left eye exhibits no chemosis, no discharge, no exudate and no hordeolum. No foreign body present in the left  eye. Right conjunctiva is injected. Left conjunctiva is not injected. No scleral icterus.  Mild swelling of upper eyelid. No pain with palpation over lid. No photophobia with exam.   Cardiovascular: Normal rate.   Pulmonary/Chest: Effort normal.  Neurological: She is alert and oriented to person, place, and time.  Skin: Skin is warm and dry. She is not diaphoretic.  Psychiatric: She has a normal mood and affect. Her behavior is normal. Judgment and thought content normal.  Vitals reviewed.    BP 120/78 (BP Location: Right Arm, Patient Position: Sitting, Cuff Size: Normal)   Pulse 74   Temp 98.1 F (36.7 C) (Oral)   Wt 138 lb (62.6 kg)   LMP 07/31/2016   SpO2 98%   BMI 25.24 kg/m  Wt Readings from Last 3 Encounters:  08/01/16 138 lb (62.6 kg)  07/28/16 137 lb 6.4 oz (62.3 kg)  09/27/15 170 lb (77.1 kg)       Assessment & Plan:  1. Allergic conjunctivitis of right eye - Provided written and verbal information regarding diagnosis and treatment. - OTC antihistamine eye drops prn per package instructions, lubricating eye drops prn - if no improvement or worsening symptoms go to ER/Urgent Care  Clarene Reamer, FNP-BC  [facility] [place], [facility]  08/01/2016 3:45 PM

## 2016-08-01 NOTE — Progress Notes (Signed)
Pre visit review using our clinic review tool, if applicable. No additional management support is needed unless otherwise documented below in the visit note. 

## 2016-08-01 NOTE — Progress Notes (Signed)
Based on what you shared with me it looks like you have a serious condition that should be evaluated in a face to face office visit.  NOTE: Even if you have entered your credit card information for this eVisit, you will not be charged.   If you are having a true medical emergency please call 911.  If you need an urgent face to face visit, [place] has four urgent care centers for your convenience.  If you need care fast and have a high deductible or no insurance consider:   [email or web address]  [phone]  [address], [address] [place], [place] [address] 8 am to 8 pm Monday-Friday 10 am to 4 pm Saturday-Sunday   The following sites will take your  insurance:    . [facility]  [phone] Get Driving Directions Find a Provider at this Location  [address] , [place] [address] . 10 am to 8 pm Monday-Friday . 12 pm to 8 pm Saturday-Sunday   . [facility]  [phone] Get Driving Directions Find a Provider at this Location  [address], [address] [place], [place] [address] . 8 am to 8 pm Monday-Friday . 9 am to 6 pm Saturday . 11 am to 6 pm Sunday   . [facility] [place]  [phone] Get Driving Directions  [address] [place], [place] [address] . 8 am to 8 pm Monday-Friday . 8 am to 4 pm Saturday-Sunday   Your e-visit answers were reviewed by a board certified advanced clinical practitioner to complete your personal care plan.  Thank you for using e-Visits.  

## 2016-08-19 ENCOUNTER — Ambulatory Visit (INDEPENDENT_AMBULATORY_CARE_PROVIDER_SITE_OTHER): Payer: Commercial Managed Care - HMO

## 2016-08-19 ENCOUNTER — Encounter: Payer: Self-pay | Admitting: Family Medicine

## 2016-08-19 ENCOUNTER — Ambulatory Visit (INDEPENDENT_AMBULATORY_CARE_PROVIDER_SITE_OTHER): Payer: Commercial Managed Care - HMO | Admitting: Family Medicine

## 2016-08-19 VITALS — BP 110/74 | HR 67 | Temp 98.7°F | Ht 61.0 in | Wt 137.0 lb

## 2016-08-19 DIAGNOSIS — M25511 Pain in right shoulder: Secondary | ICD-10-CM

## 2016-08-19 NOTE — Progress Notes (Signed)
Kayla Solis is a 36 y.o. female here for an acute visit.  History of Present Illness:   Elmon Kirschner CMA acting as scribe for Dr. Juleen China.   CC: Patient comes in today for right arm pain that has lasted about three weeks. Patient denies any injury. She has taken ibuprofen that did not relieve symptoms. 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  Arm Pain   The incident occurred 5 to 7 days ago. There was no injury mechanism. The pain is present in the upper right arm, right shoulder and right forearm. The quality of the pain is described as aching and shooting. The pain radiates to the right neck. The pain is mild. The pain has been intermittent since the incident. Associated symptoms include numbness. Pertinent negatives include no muscle weakness. The symptoms are aggravated by lifting and movement. She has tried acetaminophen and ice for the symptoms. The treatment provided mild relief.     PMHx, SurgHx, SocialHx, Medications, and Allergies were reviewed in the Visit Navigator and updated as appropriate.  Current Medications:   .  ferrous sulfate 325 (65 FE) MG tablet, Take 325 mg by mouth daily with breakfast., Disp: , Rfl:  .  fluticasone (FLONASE) 50 MCG/ACT nasal spray, Place 2 sprays into both nostrils daily., Disp: 16 g, Rfl: 6   Review of Systems:   Review of Systems  Constitutional: Negative.   HENT: Negative.   Eyes: Negative.   Respiratory: Negative.   Cardiovascular: Negative.   Genitourinary: Negative.   Musculoskeletal: Positive for joint pain. Negative for back pain, falls and neck pain.  Skin: Negative.   Neurological: Positive for numbness.  Endo/Heme/Allergies: Bruises/bleeds easily.  Psychiatric/Behavioral: Negative.    Vitals:   Vitals:   08/19/16 1144  BP: 110/74  Pulse: 67  Temp: 98.7 F (37.1 C)  TempSrc: Oral  SpO2: 99%  Weight: 137 lb (62.1 kg)  Height: 5\' 1"  (1.549 m)     Body mass index is 25.89 kg/m.  Physical Exam:    Physical Exam  Constitutional: She appears well-nourished.  HENT:  Head: Normocephalic and atraumatic.  Eyes: EOM are normal. Pupils are equal, round, and reactive to light.  Neck: Normal range of motion. Neck supple.  Cardiovascular: Normal rate, regular rhythm, normal heart sounds and intact distal pulses.   Pulmonary/Chest: Effort normal.  Abdominal: Soft.  Musculoskeletal:       Arms: Skin: Skin is warm.  Psychiatric: She has a normal mood and affect. Her behavior is normal.  Nursing note and vitals reviewed.  EXAM: RIGHT SHOULDER - 2+ VIEW  FINDINGS: No fracture. There is slight superior displacement of the peripheral clavicle with respect to the acromion. AC joint injury is not excluded. Visualized right lung is unremarkable.  IMPRESSION: Possible AC joint injury.  No evidence of fracture.  Assessment and Plan:   Kayla Solis was seen today for arm pain.  Diagnoses and all orders for this visit:  Pain in joint of right shoulder Comments: I suspect that this is due to repetitive use injury. The patient is breast-feeding. Recommended heat and anti-inflammatories. I did give her handout with recommended exercises and stretches. If no improvement over the next 2 weeks, she has been instructed to make an appointment with Dr. Paulla Fore in sports medicine. Orders: -     DG Shoulder Right   . Reviewed expectations re: course of current medical issues. . Discussed self-management of symptoms. . Outlined signs and symptoms indicating need for more acute intervention. Marland Kitchen  Patient verbalized understanding and all questions were answered. . See orders for this visit as documented in the electronic medical record. . Patient received an After-Visit Summary.  CMA served as Education administrator during this visit. History, Physical, and Plan performed by medical provider. Documentation and orders reviewed and attested to. Briscoe Deutscher, D.O.  Briscoe Deutscher, D.O. [place],  [facility] 08/21/2016

## 2016-12-14 DIAGNOSIS — R220 Localized swelling, mass and lump, head: Secondary | ICD-10-CM | POA: Diagnosis not present

## 2016-12-14 DIAGNOSIS — R030 Elevated blood-pressure reading, without diagnosis of hypertension: Secondary | ICD-10-CM | POA: Diagnosis not present

## 2017-02-05 ENCOUNTER — Encounter: Payer: Self-pay | Admitting: Family Medicine

## 2017-02-05 ENCOUNTER — Ambulatory Visit (INDEPENDENT_AMBULATORY_CARE_PROVIDER_SITE_OTHER): Payer: Commercial Managed Care - HMO | Admitting: Family Medicine

## 2017-02-05 ENCOUNTER — Other Ambulatory Visit (HOSPITAL_COMMUNITY)
Admission: RE | Admit: 2017-02-05 | Discharge: 2017-02-05 | Disposition: A | Discharge status: Home / Self Care | Payer: Commercial Managed Care - HMO | Source: Ambulatory Visit | Attending: Family Medicine | Admitting: Family Medicine

## 2017-02-05 VITALS — BP 114/74 | HR 73 | Temp 98.4°F | Ht 61.0 in | Wt 141.6 lb

## 2017-02-05 DIAGNOSIS — Z0000 Encounter for general adult medical examination without abnormal findings: Secondary | ICD-10-CM | POA: Diagnosis not present

## 2017-02-05 DIAGNOSIS — Z800 Family history of malignant neoplasm of digestive organs: Secondary | ICD-10-CM | POA: Diagnosis not present

## 2017-02-05 DIAGNOSIS — Z124 Encounter for screening for malignant neoplasm of cervix: Secondary | ICD-10-CM | POA: Diagnosis not present

## 2017-02-05 DIAGNOSIS — Z23 Encounter for immunization: Secondary | ICD-10-CM | POA: Diagnosis not present

## 2017-02-05 NOTE — Progress Notes (Signed)
Subjective:    Kayla Solis is a 36 y.o. female and is here for a comprehensive physical exam.  Pertinent Gynecological History: Patient's last menstrual period was 01/13/2017. Sexually active: Yes with female Menses: regular every month without intermenstrual spotting Last pap: normal   OB History    Gravida Para Term Preterm AB Living   3 2 2  0 1 2   SAB TAB Ectopic Multiple Live Births   1 0 0 0 2     Health Maintenance Due  Topic Date Due  . PAP SMEAR  12/13/2012   PMHx, SurgHx, SocialHx, Medications, and Allergies were reviewed in the Visit Navigator and updated as appropriate.   Past Medical History:  Diagnosis Date  . Allergic rhinitis   . Asthma   . Depression with anxiety   . Family history of breast cancer in mother 07/28/2016  . Gestational diabetes mellitus 08/10/2015  . Iron deficiency anemia 07/28/2016   Past Surgical History:  Procedure Laterality Date  . DILATION AND CURETTAGE OF UTERUS N/A 03/01/2014   Procedure: DILATATION AND CURETTAGE;  Surgeon: Kayla Schools, MD;  Location: [facility];  Service: Gynecology;  Laterality: N/A;  . TONSILLECTOMY     Family History  Problem Relation Age of Onset  . Breast cancer Mother   . Colon cancer Maternal Grandmother    Social History  Substance Use Topics  . Smoking status: Never Smoker  . Smokeless tobacco: Never Used  . Alcohol use No   Review of Systems:   Pertinent items are noted in the HPI. Otherwise, ROS is negative.  Objective:   BP 114/74   Pulse 73   Temp 98.4 F (36.9 C) (Oral)   Ht 5\' 1"  (1.549 m)   Wt 141 lb 9.6 oz (64.2 kg)   LMP 01/13/2017   SpO2 100%   BMI 26.76 kg/m   Wt Readings from Last 3 Encounters:  02/05/17 141 lb 9.6 oz (64.2 kg)  08/19/16 137 lb (62.1 kg)  08/01/16 138 lb (62.6 kg)     Ht Readings from Last 3 Encounters:  02/05/17 5\' 1"  (1.549 m)  08/19/16 5\' 1"  (1.549 m)  07/28/16 5\' 2"  (1.575 m)   General appearance: alert, cooperative and appears stated  age. Head: normocephalic, without obvious abnormality, atraumatic. Neck: no adenopathy, supple, symmetrical, trachea midline; thyroid not enlarged, symmetric, no tenderness/mass/nodules. Lungs: clear to auscultation bilaterally. Heart: regular rate and rhythm Abdomen: soft, non-tender; no masses,  no organomegaly. Extremities: extremities normal, atraumatic, no cyanosis or edema. Skin: skin color, texture, turgor normal, no rashes or lesions. Lymph: cervical, supraclavicular, and axillary nodes normal; no abnormal inguinal nodes palpated. Neurologic: grossly normal.  Pelvic:  External genitalia: no lesions.              Urethra: normal appearing urethra with no masses, tenderness or lesions.              Bartholins and Skenes: normal.               Vagina: normal appearing vagina with normal color and discharge, no lesions.              Cervix: normal appearance.              Pap and high risk HPV testing done: Yes.  .        Bimanual Exam:   Uterus: uterus is normal size, shape, consistency and nontender.  Adnexa: normal adnexa in size, nontender and no masses.                                      Assessment/Plan:   Kayla Solis was seen today for annual exam.  Diagnoses and all orders for this visit:  Routine physical examination  Screening for cervical cancer -     Cytology - PAP  Need for immunization against influenza -     Flu Vaccine QUAD 36+ mos IM  Family history of colon cancer requiring screening colonoscopy Comments: 46 year old sister just diagnosed with colon cancer. Will send to GI for screening.  Orders: -     Ambulatory referral to Gastroenterology    Patient Counseling:   [x]     Nutrition: Stressed importance of moderation in sodium/caffeine intake, saturated fat and cholesterol, caloric balance, sufficient intake of fresh fruits, vegetables, fiber, calcium, iron, and 1 mg of folate supplement per day (for females capable  of pregnancy).   [x]      Stressed the importance of regular exercise.    [x]     Substance Abuse: Discussed cessation/primary prevention of tobacco, alcohol, or other drug use; driving or other dangerous activities under the influence; availability of treatment for abuse.    [x]      Injury prevention: Discussed safety belts, safety helmets, smoke detector, smoking near bedding or upholstery.    [x]      Sexuality: Discussed sexually transmitted diseases, partner selection, use of condoms, avoidance of unintended pregnancy  and contraceptive alternatives.    [x]     Dental health: Discussed importance of regular tooth brushing, flossing, and dental visits.   [x]      Health maintenance and immunizations reviewed. Please refer to Health maintenance section.   Kayla Deutscher, DO [place]

## 2017-02-06 ENCOUNTER — Encounter: Payer: Self-pay | Admitting: Family Medicine

## 2017-02-06 LAB — CYTOLOGY - PAP
Adequacy: ABSENT
Diagnosis: NEGATIVE

## 2017-02-09 ENCOUNTER — Encounter: Payer: Self-pay | Admitting: Nurse Practitioner

## 2017-02-17 ENCOUNTER — Ambulatory Visit: Payer: Commercial Managed Care - HMO | Admitting: Nurse Practitioner

## 2017-02-17 ENCOUNTER — Encounter: Payer: Self-pay | Admitting: Nurse Practitioner

## 2017-02-17 VITALS — BP 98/62 | HR 56 | Ht 63.0 in | Wt 141.8 lb

## 2017-02-17 DIAGNOSIS — K625 Hemorrhage of anus and rectum: Secondary | ICD-10-CM

## 2017-02-17 DIAGNOSIS — Z800 Family history of malignant neoplasm of digestive organs: Secondary | ICD-10-CM

## 2017-02-17 MED ORDER — NA SULFATE-K SULFATE-MG SULF 17.5-3.13-1.6 GM/177ML PO SOLN: ORAL | 0 refills | Status: DC

## 2017-02-17 NOTE — Patient Instructions (Signed)
If you are age 37 or older, your body mass index should be between 23-30. Your Body mass index is 25.12 kg/m. If this is out of the aforementioned range listed, please consider follow up with your Primary Care Provider.  If you are age 88 or younger, your body mass index should be between 19-25. Your Body mass index is 25.12 kg/m. If this is out of the aformentioned range listed, please consider follow up with your Primary Care Provider.   You have been scheduled for a colonoscopy. Please follow written instructions given to you at your visit today.  Please pick up your prep supplies at the pharmacy within the next 1-3 days. If you use inhalers (even only as needed), please bring them with you on the day of your procedure. Your physician has requested that you go to www.startemmi.com and enter the access code given to you at your visit today. This web site gives a general overview about your procedure. However, you should still follow specific instructions given to you by our office regarding your preparation for the procedure.  We have sent the following medications to your pharmacy for you to pick up at your convenience: Suprep  Thank you for choosing me and [facility].   Tye Savoy, NP

## 2017-02-17 NOTE — Progress Notes (Signed)
      HPI:  36 yo female referred by PCP Dr. Juleen China for [facility] of colon cancer.  Patient's 8 yo sister was diagnosed with colon cancer in October at age 40. She is undergoing chemoXRT now and then having a resection. Mother died of metastatic breast cancer. Maternal Grandmother had CRC , age at diagnosis unknown.  Patient has was occasional rectal bleeding which she attributes to hemorrhoids and this has been going on for years. Her BMs are normal, no constipation. Weight is stable. Menstrual cycles overall normal.    Past Medical History:  Diagnosis Date  . Allergic rhinitis   . Asthma   . Depression with anxiety   . Family history of breast cancer in mother 07/28/2016  . Gestational diabetes mellitus 08/10/2015  . Iron deficiency anemia 07/28/2016    Past Surgical History:  Procedure Laterality Date  . TONSILLECTOMY     Family History  Problem Relation Age of Onset  . Breast cancer Mother   . Colon cancer Maternal Grandmother   . Colon cancer Sister    Social History   Tobacco Use  . Smoking status: Never Smoker  . Smokeless tobacco: Never Used  Substance Use Topics  . Alcohol use: No  . Drug use: No   No current outpatient medications on file.   No current facility-administered medications for this visit.    No Known Allergies   Review of Systems: All systems reviewed and negative except where noted in HPI.    Physical Exam: BP 98/62   Pulse (!) 56   Ht 5\' 3"  (1.6 m)   Wt 141 lb 12.8 oz (64.3 kg)   BMI 25.12 kg/m  Constitutional:  Well-developed, white female in no acute distress. Psychiatric: Normal mood and affect. Behavior is normal. EENT: Pupils normal.  Conjunctivae are normal. No scleral icterus. Neck supple.  Cardiovascular: Normal rate, regular rhythm. No edema Pulmonary/chest: Effort normal and breath sounds normal. No wheezing, rales or rhonchi. Abdominal: Soft, nondistended. Nontender. Bowel sounds active throughout. There are no masses  palpable. No hepatomegaly. Lymphadenopathy: No cervical adenopathy noted. Neurological: Alert and oriented to person place and time. Skin: Skin is warm and dry. No rashes noted.   ASSESSMENT AND PLAN:  36 yo female with [facility] of colon cancer. Her 53 yo sister was just diagnosed with colon cancer, is undergoing chemoxrt to be followed by resection. Patient has occasional painless rectal bleeding herself but feels hemorrhoid related.  -Rectal bleeding probably is hemorrhoidal but it is the same way her sister presented. To rule out other etiologies patient will be scheduled for a screening colonoscopy. The risks and benefits of the procedure were discussed and the patient agrees to proceed.   Tye Savoy, NP  02/17/2017, 2:55 PM  Cc: Briscoe Deutscher, DO

## 2017-02-19 ENCOUNTER — Encounter: Payer: Self-pay | Admitting: Nurse Practitioner

## 2017-02-19 NOTE — Progress Notes (Signed)
Agree with assessment and plan as outlined.  

## 2017-02-27 ENCOUNTER — Ambulatory Visit (AMBULATORY_SURGERY_CENTER): Payer: 59 | Admitting: Gastroenterology

## 2017-02-27 ENCOUNTER — Other Ambulatory Visit: Payer: Self-pay

## 2017-02-27 ENCOUNTER — Encounter: Payer: Self-pay | Admitting: Gastroenterology

## 2017-02-27 VITALS — BP 108/64 | HR 69 | Temp 98.6°F | Resp 11 | Ht 63.0 in | Wt 141.0 lb

## 2017-02-27 DIAGNOSIS — Z1212 Encounter for screening for malignant neoplasm of rectum: Secondary | ICD-10-CM

## 2017-02-27 DIAGNOSIS — Z1211 Encounter for screening for malignant neoplasm of colon: Secondary | ICD-10-CM | POA: Diagnosis not present

## 2017-02-27 DIAGNOSIS — Z800 Family history of malignant neoplasm of digestive organs: Secondary | ICD-10-CM | POA: Diagnosis present

## 2017-02-27 DIAGNOSIS — D120 Benign neoplasm of cecum: Secondary | ICD-10-CM | POA: Diagnosis not present

## 2017-02-27 MED ORDER — SODIUM CHLORIDE 0.9 % IV SOLN: 500.0000 mL | INTRAVENOUS | Status: DC

## 2017-02-27 NOTE — Progress Notes (Signed)
Called to room to assist during endoscopic procedure.  Patient ID and intended procedure confirmed with present staff. Received instructions for my participation in the procedure from the performing physician.  

## 2017-02-27 NOTE — Progress Notes (Signed)
Report given to PACU, vss 

## 2017-02-27 NOTE — Op Note (Signed)
[facility] Patient Name: Kayla Solis Procedure Date: 02/27/2017 11:28 AM MRN: [id number] Endoscopist: Remo Lipps P. Armbruster MD, MD Age: 36 Referring MD:  Date of Birth: 1981/03/04 Gender: Female Account #: [id number] Procedure:                Colonoscopy Indications:              Screening in patient at increased risk: sister with                            colon cancer diagnosed age 99s, grandmother with                            colon cancer, mother with breast cancer Medicines:                Monitored Anesthesia Care Procedure:                Pre-Anesthesia Assessment:                           - Prior to the procedure, a History and Physical                            was performed, and patient medications and                            allergies were reviewed. The patient's tolerance of                            previous anesthesia was also reviewed. The risks                            and benefits of the procedure and the sedation                            options and risks were discussed with the patient.                            All questions were answered, and informed consent                            was obtained. Prior Anticoagulants: The patient has                            taken no previous anticoagulant or antiplatelet                            agents. ASA Grade Assessment: I - A normal, healthy                            patient. After reviewing the risks and benefits,                            the patient was deemed in satisfactory condition to  undergo the procedure.                           After obtaining informed consent, the colonoscope                            was passed under direct vision. Throughout the                            procedure, the patient's blood pressure, pulse, and                            oxygen saturations were monitored continuously. The                            Model PCF-H190DL  [id number]) scope was introduced                            through the anus and advanced to the the terminal                            ileum, with identification of the appendiceal                            orifice and IC valve. The colonoscopy was performed                            without difficulty. The patient tolerated the                            procedure well. The quality of the bowel                            preparation was good. The terminal ileum, ileocecal                            valve, appendiceal orifice, and rectum were                            photographed. Scope In: 11:36:16 AM Scope Out: 11:55:39 AM Scope Withdrawal Time: 0 hours 16 minutes 10 seconds  Total Procedure Duration: 0 hours 19 minutes 23 seconds  Findings:                 The perianal and digital rectal examinations were                            normal.                           The terminal ileum appeared normal.                           A 4 to 5 mm polyp was found in the ileocecal valve.  The polyp was flat. The polyp was removed with a                            cold snare. Resection and retrieval were complete.                           The exam was otherwise without abnormality. Complications:            No immediate complications. Estimated blood loss:                            Minimal. Estimated Blood Loss:     Estimated blood loss was minimal. Impression:               - The examined portion of the ileum was normal.                           - One 4 to 5 mm polyp at the ileocecal valve,                            removed with a cold snare. Resected and retrieved.                           - The examination was otherwise normal. Recommendation:           - Patient has a contact number available for                            emergencies. The signs and symptoms of potential                            delayed complications were discussed with the                             patient. Return to normal activities tomorrow.                            Written discharge instructions were provided to the                            patient.                           - Resume previous diet.                           - Continue present medications.                           - Await pathology results.                           - Recommend consultation to genetic counseling to                            evaluate for hereditary cancer syndrome (rule  out                            Lynch syndrome) Remo Lipps P. Armbruster MD, MD 02/27/2017 12:01:24 PM This report has been signed electronically.

## 2017-02-27 NOTE — Patient Instructions (Signed)
YOU HAD AN ENDOSCOPIC PROCEDURE TODAY AT [facility]:   Refer to the procedure report that was given to you for any specific questions about what was found during the examination.  If the procedure report does not answer your questions, please call your gastroenterologist to clarify.  If you requested that your care partner not be given the details of your procedure findings, then the procedure report has been included in a sealed envelope for you to review at your convenience later.  YOU SHOULD EXPECT: Some feelings of bloating in the abdomen. Passage of more gas than usual.  Walking can help get rid of the air that was put into your GI tract during the procedure and reduce the bloating. If you had a lower endoscopy (such as a colonoscopy or flexible sigmoidoscopy) you may notice spotting of blood in your stool or on the toilet paper. If you underwent a bowel prep for your procedure, you may not have a normal bowel movement for a few days.  Please Note:  You might notice some irritation and congestion in your nose or some drainage.  This is from the oxygen used during your procedure.  There is no need for concern and it should clear up in a day or so.  SYMPTOMS TO REPORT IMMEDIATELY:   Following lower endoscopy (colonoscopy or flexible sigmoidoscopy):  Excessive amounts of blood in the stool  Significant tenderness or worsening of abdominal pains  Swelling of the abdomen that is new, acute  Fever of 100F or higher  For urgent or emergent issues, a gastroenterologist can be reached at any hour by calling [phone].   DIET:  We do recommend a small meal at first, but then you may proceed to your regular diet.  Drink plenty of fluids but you should avoid alcoholic beverages for 24 hours.  ACTIVITY:  You should plan to take it easy for the rest of today and you should NOT DRIVE or use heavy machinery until tomorrow (because of the sedation medicines used during the test).     FOLLOW UP: Our staff will call the number listed on your records the next business day following your procedure to check on you and address any questions or concerns that you may have regarding the information given to you following your procedure. If we do not reach you, we will leave a message.  However, if you are feeling well and you are not experiencing any problems, there is no need to return our call.  We will assume that you have returned to your regular daily activities without incident.  If any biopsies were taken you will be contacted by phone or by letter within the next 1-3 weeks.  Please call us at [phone] if you have not heard about the biopsies in 3 weeks.   Await for biopsy results to determine next repeat Colonoscopy Polyp (handout given)   SIGNATURES/CONFIDENTIALITY: You and/or your care partner have signed paperwork which will be entered into your electronic medical record.  These signatures attest to the fact that that the information above on your After Visit Summary has been reviewed and is understood.  Full responsibility of the confidentiality of this discharge information lies with you and/or your care-partner.

## 2017-03-02 ENCOUNTER — Telehealth: Payer: Self-pay

## 2017-03-02 ENCOUNTER — Telehealth: Payer: Self-pay | Admitting: *Deleted

## 2017-03-02 NOTE — Telephone Encounter (Signed)
No answer, message left for the patient. 

## 2017-03-02 NOTE — Telephone Encounter (Signed)
Left message

## 2017-03-04 ENCOUNTER — Other Ambulatory Visit: Payer: Self-pay

## 2017-03-04 DIAGNOSIS — Z800 Family history of malignant neoplasm of digestive organs: Secondary | ICD-10-CM

## 2017-03-10 ENCOUNTER — Encounter: Payer: Self-pay | Admitting: Genetic Counselor

## 2017-03-10 ENCOUNTER — Telehealth: Payer: Self-pay | Admitting: Genetic Counselor

## 2017-03-10 ENCOUNTER — Telehealth: Payer: Self-pay

## 2017-03-10 NOTE — Telephone Encounter (Signed)
-----   Message from Yetta Flock, MD sent at 03/02/2017  1:07 PM EST ----- I think okay to proceed with referral now. I spoke with the patient and her husband after the procedure and they wanted to proceed. Thanks  ----- Message ----- From: Doristine Counter, RN Sent: 03/02/2017  10:19 AM To: Yetta Flock, MD  Do you want to wait on path report before I make the referral to genetic counselor?

## 2017-03-10 NOTE — Telephone Encounter (Signed)
Genetic counseling appt has been scheduled for the pt to see Kayla Solis on 04/15/17 at [facility] has been mailed to the pt.

## 2017-03-10 NOTE — Telephone Encounter (Signed)
Referral to genetic counseling placed, called and spoke to Seth Bake at the [facility]. She will contact patient to schedule appointment.

## 2017-04-15 ENCOUNTER — Ambulatory Visit (HOSPITAL_BASED_OUTPATIENT_CLINIC_OR_DEPARTMENT_OTHER): Payer: 59 | Admitting: Genetic Counselor

## 2017-04-15 ENCOUNTER — Encounter: Payer: Self-pay | Admitting: Genetic Counselor

## 2017-04-15 ENCOUNTER — Other Ambulatory Visit: Payer: 59

## 2017-04-15 DIAGNOSIS — Z7183 Encounter for nonprocreative genetic counseling: Secondary | ICD-10-CM | POA: Diagnosis not present

## 2017-04-15 DIAGNOSIS — K635 Polyp of colon: Secondary | ICD-10-CM | POA: Diagnosis not present

## 2017-04-15 DIAGNOSIS — Z800 Family history of malignant neoplasm of digestive organs: Secondary | ICD-10-CM | POA: Diagnosis not present

## 2017-04-15 DIAGNOSIS — Z803 Family history of malignant neoplasm of breast: Secondary | ICD-10-CM | POA: Diagnosis not present

## 2017-04-15 NOTE — Progress Notes (Signed)
REFERRING PROVIDER: Yetta Flock, MD [address] [place], [place] [address]  PRIMARY PROVIDER:  Briscoe Deutscher, DO  PRIMARY REASON FOR VISIT:  1. Polyp of colon, unspecified part of colon, unspecified type   2. Family history of breast cancer   3. Family history of colon cancer      HISTORY OF PRESENT ILLNESS:   Kayla Solis, a 37 y.o. female, was seen for a Kayla Solis cancer genetics consultation at the request of Kayla Solis due to a family history of cancer.  Kayla Solis presents to clinic today to discuss the possibility of a hereditary predisposition to cancer, genetic testing, and to further clarify her future cancer risks, as well as potential cancer risks for family members. Kayla Solis is a 37 y.o. female with no personal history of cancer.  Her sister recently had a baby and soon afterward noticed blood in her stool.  She had a colonoscopy which found colon cancer.  She is being treated with chemotherapy and radiation, and will have a colon resection in June.  The patient had a colonoscopy which found a 4-5 mm, flat sessile serrated polyp.  CANCER HISTORY:   No history exists.     HORMONAL RISK FACTORS:  Menarche was at age 87.  First live birth at age 45.  OCP use for approximately 12 years.  Ovaries intact: yes.  Hysterectomy: no.  Menopausal status: premenopausal.  HRT use: 0 years. Colonoscopy: yes; one flat sessile serrated polyp. Mammogram within the last year: n/a. Number of breast biopsies: 0. Up to date with pelvic exams:  yes. Any excessive radiation exposure in the past:  no  Past Medical History:  Diagnosis Date  . Allergic rhinitis   . Asthma   . Depression with anxiety   . Family history of breast cancer   . Family history of breast cancer in mother 07/28/2016  . Family history of colon cancer   . Gestational diabetes mellitus 08/10/2015  . Iron deficiency anemia 07/28/2016    Past Surgical History:  Procedure Laterality Date  .  DILATION AND CURETTAGE OF UTERUS N/A 03/01/2014   Procedure: DILATATION AND CURETTAGE;  Surgeon: Kayla Schools, MD;  Location: [facility];  Service: Gynecology;  Laterality: N/A;  . TONSILLECTOMY      Social History   Socioeconomic History  . Marital status: Married    Spouse name: Not on file  . Number of children: 2  . Years of education: Not on file  . Highest education level: Not on file  Social Needs  . Financial resource strain: Not on file  . Food insecurity - worry: Not on file  . Food insecurity - inability: Not on file  . Transportation needs - medical: Not on file  . Transportation needs - non-medical: Not on file  Occupational History  . Occupation: Copywriter, advertising: [employer]  Tobacco Use  . Smoking status: Never Smoker  . Smokeless tobacco: Never Used  Substance and Sexual Activity  . Alcohol use: No  . Drug use: No  . Sexual activity: Yes    Birth control/protection: None  Other Topics Concern  . Not on file  Social History Narrative  . Not on file     FAMILY HISTORY:  We obtained a detailed, 4-generation family history.  Significant diagnoses are listed below: Family History  Problem Relation Age of Onset  . Breast cancer Mother 61       metastatic breast cancer  .  Colon cancer Maternal Grandmother        dx in her 53s  . Heart attack Maternal Grandmother   . Colon cancer Sister 85  . Skin cancer Father   . Heart attack Maternal Grandfather   . Heart attack Maternal Aunt   . Pneumonia Maternal Uncle     The patient has two daughter who are cancer free. She has one sister who was recently diagnosed with colon cancer at age 66.  She has not undergone genetic testing yet, but was told that they will do some testing after her colon surgery in June.  The patient's mother is deceased and her father is living.  The patient's father may have some skin cancers that have been taken off, but otherwise is healthy in his 48's.  He is an only  child.  Both paternal grandparents are deceased from non cancer related issues.  The patient's mother was diagnosed with metastatic breast cancer at 76 and died 3 days later.  She had three sisters and two brothers.  One brother is estranged, and one brother died of pneumonia.  Two aunts are living and are cancer free and one aunt died from a heart attack.  Both maternal grandparents are deceased.  The grandfather died from a heart attack and the grandmother was diagnosed with colon cancer in her 55's and died of a heart attack in her 93's.  Kayla Solis is unaware of previous family history of genetic testing for hereditary cancer risks. Patient's maternal ancestors are of [place] descent, and paternal ancestors are of [place] descent. There is no reported Ashkenazi Jewish ancestry. There is no known consanguinity.  GENETIC COUNSELING ASSESSMENT: Kayla Solis is a 37 y.o. female with a family history of colon and breast cancer which is somewhat suggestive of a hereditary cancer syndrome and predisposition to cancer. We, therefore, discussed and recommended the following at today's visit.   DISCUSSION: We discussed that about 5-6% of colon cancer is hereditary, with most cases due to Lynch syndrome.  About 5-10% of breast cancer is hereditary with most cases due to BRCA mutations.  Based on the family history of cancer and young age of onset of colon cancer in her sister, we discussed Lynch syndrome and CHEK2 are the most common causes of the cancer in the family.  Other genes can increase the risk for colon cancer and other genes can increase the risk for breast cancer.  We reviewed the characteristics, features and inheritance patterns of hereditary cancer syndromes. We also discussed genetic testing, including the appropriate family members to test, the process of testing, insurance coverage and turn-around-time for results. Based on her sister's young age of onset, her sister is the best person to test.  If  the patient tests negative, then we need to see what her sister's results is before being able to provide more specific risk assessments.  For example, if her sister is diagnosed with Lynch syndrome and Kayla Solis is tested and does not have Lynch syndrome, that could affect the way that we follow her.  We discussed the implications of a negative, positive and/or variant of uncertain significant result. We recommended Kayla Solis pursue genetic testing for the Common hereditary cancer gene panel. The Hereditary Gene Panel offered by [facility] includes sequencing and/or deletion duplication testing of the following 47 genes: APC, ATM, AXIN2, BARD1, BMPR1A, BRCA1, BRCA2, BRIP1, CDH1, CDK4, CDKN2A (p14ARF), CDKN2A (p16INK4a), CHEK2, CTNNA1, DICER1, EPCAM (Deletion/duplication testing only), GREM1 (promoter region deletion/duplication testing only), KIT,  MEN1, MLH1, MSH2, MSH3, MSH6, MUTYH, NBN, NF1, NHTL1, PALB2, PDGFRA, PMS2, POLD1, POLE, PTEN, RAD50, RAD51C, RAD51D, SDHB, SDHC, SDHD, SMAD4, SMARCA4. STK11, TP53, TSC1, TSC2, and VHL.  The following genes were evaluated for sequence changes only: SDHA and HOXB13 c.251G>A variant only.   Based on Kayla Solis's family history of cancer, she meets medical criteria for genetic testing. Despite that she meets criteria, she may still have an out of pocket cost. We discussed that if her out of pocket cost for testing is over $100, the laboratory will call and confirm whether she wants to proceed with testing.  If the out of pocket cost of testing is less than $100 she will be billed by the genetic testing laboratory.   PLAN: After considering the risks, benefits, and limitations, Kayla Solis  provided informed consent to pursue genetic testing and the blood sample was sent to [facility] for analysis of the Common Hereditary cancer panel. Results should be available within approximately 2-3 weeks' time, at which point they will be disclosed by telephone to Kayla Solis, as  will any additional recommendations warranted by these results. Kayla Solis will receive a summary of her genetic counseling visit and a copy of her results once available. This information will also be available in Epic. We encouraged Kayla Solis to remain in contact with cancer genetics annually so that we can continuously update the family history and inform her of any changes in cancer genetics and testing that may be of benefit for her family. Kayla Solis questions were answered to her satisfaction today. Our contact information was provided should additional questions or concerns arise.  Lastly, we encouraged Kayla Solis to remain in contact with cancer genetics annually so that we can continuously update the family history and inform her of any changes in cancer genetics and testing that may be of benefit for this family.   Ms.  Solis questions were answered to her satisfaction today. Our contact information was provided should additional questions or concerns arise. Thank you for the referral and allowing [place] to share in the care of your patient.   Karen P. Florene Glen, [place], [facility] Certified Genetic Counselor [email or web address] phone: [phone]  The patient was seen for a total of 45 minutes in face-to-face genetic counseling.  This patient was discussed with Drs. Magrinat, Lindi Adie and/or Burr Medico who agrees with the above.    _______________________________________________________________________ For Office Staff:  Number of people involved in session: 1 Was an Intern/ student involved with case: no

## 2017-04-24 ENCOUNTER — Telehealth: Payer: Self-pay | Admitting: Genetic Counselor

## 2017-04-24 ENCOUNTER — Ambulatory Visit: Payer: Self-pay | Admitting: Genetic Counselor

## 2017-04-24 ENCOUNTER — Encounter: Payer: Self-pay | Admitting: Genetic Counselor

## 2017-04-24 DIAGNOSIS — K635 Polyp of colon: Secondary | ICD-10-CM

## 2017-04-24 DIAGNOSIS — Z1379 Encounter for other screening for genetic and chromosomal anomalies: Secondary | ICD-10-CM | POA: Insufficient documentation

## 2017-04-24 DIAGNOSIS — Z800 Family history of malignant neoplasm of digestive organs: Secondary | ICD-10-CM

## 2017-04-24 DIAGNOSIS — Z803 Family history of malignant neoplasm of breast: Secondary | ICD-10-CM

## 2017-04-24 NOTE — Telephone Encounter (Signed)
Revealed negative genetic testing.  Discussed that we do not know why  there is cancer in the family. It could be due to a different gene that we are not testing, or maybe our current technology may not be able to pick something up.  It will be important for her to keep in contact with genetics to keep up with whether additional testing may be needed.  Recommended that her sister undergo genetic testing.  Discussed that two VUS were identified.  These will not change her medical management and we will recontact her once they are reclassified.

## 2017-04-24 NOTE — Progress Notes (Addendum)
HPI:  Kayla Solis was previously seen in the [facility] clinic due to a personal history of colon polyps and a family history of cancer and concerns regarding a hereditary predisposition to cancer. Please refer to our prior cancer genetics clinic note for more information regarding Kayla Solis's medical, social and family histories, and our assessment and recommendations, at the time. Kayla Solis recent genetic test results were disclosed to her, as were recommendations warranted by these results. These results and recommendations are discussed in more detail below.  CANCER HISTORY:   No history exists.    FAMILY HISTORY:  We obtained a detailed, 4-generation family history.  Significant diagnoses are listed below: Family History  Problem Relation Age of Onset   Breast cancer Mother 34       metastatic breast cancer   Colon cancer Maternal Grandmother        dx in her 46s   Heart attack Maternal Grandmother    Colon cancer Sister 89   Skin cancer Father    Heart attack Maternal Grandfather    Heart attack Maternal Aunt    Pneumonia Maternal Uncle     The patient has two daughter who are cancer free. She has one sister who was recently diagnosed with colon cancer at age 76.  She has not undergone genetic testing yet, but was told that they will do some testing after her colon surgery in June.  The patient's mother is deceased and her father is living.   The patient's father may have some skin cancers that have been taken off, but otherwise is healthy in his 44's.  He is an only child.  Both paternal grandparents are deceased from non cancer related issues.   The patient's mother was diagnosed with metastatic breast cancer at 79 and died 3 days later.  She had three sisters and two brothers.  One brother is estranged, and one brother died of pneumonia.  Two aunts are living and are cancer free and one aunt died from a heart attack.  Both maternal grandparents are deceased.  The  grandfather died from a heart attack and the grandmother was diagnosed with colon cancer in her 20's and died of a heart attack in her 70's.   Kayla Solis is unaware of previous family history of genetic testing for hereditary cancer risks. Patient's maternal ancestors are of [place] descent, and paternal ancestors are of [place] descent. There is no reported Ashkenazi Jewish ancestry. There is no known consanguinity.  GENETIC TEST RESULTS: Genetic testing reported out on April 23, 2017 through the common hereditary cancer panel found no deleterious mutations.  The Hereditary Gene Panel offered by [facility] includes sequencing and/or deletion duplication testing of the following 47 genes: APC, ATM, AXIN2, BARD1, BMPR1A, BRCA1, BRCA2, BRIP1, CDH1, CDK4, CDKN2A (p14ARF), CDKN2A (p16INK4a), CHEK2, CTNNA1, DICER1, EPCAM (Deletion/duplication testing only), GREM1 (promoter region deletion/duplication testing only), KIT, MEN1, MLH1, MSH2, MSH3, MSH6, MUTYH, NBN, NF1, NHTL1, PALB2, PDGFRA, PMS2, POLD1, POLE, PTEN, RAD50, RAD51C, RAD51D, SDHB, SDHC, SDHD, SMAD4, SMARCA4. STK11, TP53, TSC1, TSC2, and VHL.  The following genes were evaluated for sequence changes only: SDHA and HOXB13 c.251G>A variant only.  The test report has been scanned into EPIC and is located under the Molecular Pathology section of the Results Review tab.    We discussed with Kayla Solis that since the current genetic testing is not perfect, it is possible there may be a gene mutation in one of these genes that current testing cannot detect,  but that chance is small.  We also discussed, that it is possible that another gene that has not yet been discovered, or that we have not yet tested, is responsible for the cancer diagnoses in the family, and it is, therefore, important to remain in touch with cancer genetics in the future so that we can continue to offer Kayla Solis the most up to date genetic testing.   Genetic testing did detect two Variants of  Unknown Significance - one in the MSH3 gene called c.1367A>C (p.Glu456Ala) and another in the VHL gene called c.117C>T (Silent). At this time, it is unknown if these variants are associated with increased cancer risk or if they are a normal finding, but most variants such as these get reclassified to being inconsequential. They should not be used to make medical management decisions. With time, we suspect the lab will determine the significance of these variants, if any. If we do learn more about them, we will try to contact Kayla Solis to discuss it further. However, it is important to stay in touch with [place] periodically and keep the address and phone number up to date.  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  The amended report date is October 29, 2020.   CANCER SCREENING RECOMMENDATIONS:  This normal result is reassuring and indicates that Kayla Solis does not likely have an increased risk of cancer due to a mutation in one of these genes.  We, therefore, recommended  Kayla Solis continue to follow the cancer screening guidelines provided by her primary healthcare providers.   Based on having a first degree relative with colon cancer at 49, recommended guidelines indicate that Kayla Solis should be screened now for colon cancer, at least a 5 year interval.  This could possibly change if her sister, who was diagnosed with colon cancer, tests positive for a hereditary cancer syndrome, that Kayla Solis was negative for.  RECOMMENDATIONS FOR FAMILY MEMBERS:  Women in this family might be at some increased risk of developing cancer, over the general population risk, simply due to the family history of cancer.  We recommended women in this family have a yearly mammogram beginning at age 46, or 64 years younger than the earliest onset of cancer, an annual clinical breast exam, and perform monthly breast self-exams. Women in this family should also have a gynecological exam as recommended by  their primary provider. All family members should have a colonoscopy by age 81.  Based on Kayla Solis family history, we recommended her sister, who was diagnosed with colon cancer at age 31, have genetic counseling and testing. Kayla Solis will let us know if we can be of any assistance in coordinating genetic counseling and/or testing for this family member.   FOLLOW-UP: Lastly, we discussed with Kayla Solis that cancer genetics is a rapidly advancing field and it is possible that new genetic tests will be appropriate for her and/or her family members in the future. We encouraged her to remain in contact with cancer genetics on an annual basis so we can update her personal and family histories and let her know of advances in cancer genetics that may benefit this family.   Our contact number was provided. Kayla Solis questions were answered to her satisfaction, and she knows she is welcome to call us at anytime with additional questions or concerns.   Roma Kayser, MS, [facility] Certified Genetic Counselor [email or web address]

## 2017-07-15 ENCOUNTER — Ambulatory Visit: Payer: 59 | Admitting: Family Medicine

## 2017-07-15 ENCOUNTER — Encounter: Payer: Self-pay | Admitting: Family Medicine

## 2017-07-15 VITALS — BP 98/64 | HR 65 | Temp 97.3°F | Ht 63.0 in | Wt 139.4 lb

## 2017-07-15 DIAGNOSIS — S76011A Strain of muscle, fascia and tendon of right hip, initial encounter: Secondary | ICD-10-CM | POA: Diagnosis not present

## 2017-07-15 NOTE — Progress Notes (Signed)
Kayla Solis is a 37 y.o. female is here for follow up.  History of Present Illness:   HPI: Patient presents today with right anterior hip flexor and groin pain.  This started about 2-3 weeks ago.  It is worse at night after working out.  Patient started working out at a gym.  She has been running for a few weeks now on the treadmill.  She states that she runs about 2-1/2 miles and 30 minutes.  She denies using any incline.  She denies any trauma.  No history of the same.  No treatment.  There are no preventive care reminders to display for this patient.   Depression screen PHQ 2/9 02/05/2017  Decreased Interest 0  Down, Depressed, Hopeless 0  PHQ - 2 Score 0   PMHx, SurgHx, SocialHx, FamHx, Medications, and Allergies were reviewed in the Visit Navigator and updated as appropriate.   Patient Active Problem List   Diagnosis Date Noted  . Genetic testing 04/24/2017  . Colon polyp   . Family history of breast cancer   . Family history of colon cancer   . Screening for cervical cancer 02/05/2017  . Iron deficiency anemia 07/28/2016  . Family history of breast cancer in mother 07/28/2016  . Depression with anxiety 01/15/2010  . Allergic rhinitis 01/06/2008  . Asthma 01/06/2008   Social History   Tobacco Use  . Smoking status: Never Smoker  . Smokeless tobacco: Never Used  Substance Use Topics  . Alcohol use: No  . Drug use: No   Current Medications and Allergies:   No current outpatient medications on file.  No Known Allergies   Review of Systems   Pertinent items are noted in the HPI. Otherwise, ROS is negative.  Vitals:   Vitals:   07/15/17 1137  BP: 98/64  Pulse: 65  Temp: (!) 97.3 F (36.3 C)  TempSrc: Oral  SpO2: 98%  Weight: 139 lb 6.4 oz (63.2 kg)  Height: 5\' 3"  (1.6 m)     Body mass index is 24.69 kg/m.   Physical Exam:   Physical Exam  Constitutional: She appears well-nourished.  HENT:  Head: Normocephalic and atraumatic.  Eyes: Pupils are  equal, round, and reactive to light. EOM are normal.  Neck: Normal range of motion. Neck supple.  Cardiovascular: Normal rate, regular rhythm, normal heart sounds and intact distal pulses.  Pulmonary/Chest: Effort normal.  Abdominal: Soft.  Musculoskeletal:       Right hip: She exhibits tenderness.       Legs: Skin: Skin is warm.  Psychiatric: She has a normal mood and affect. Her behavior is normal.  Nursing note and vitals reviewed.   Assessment and Plan:   Kayla Solis was seen today for groin pain.  Diagnoses and all orders for this visit:  Strain of flexor muscle of right hip, initial encounter Comments: Diagnosis reviewed.  Of asked her to hold on running for 2-3 weeks.  We discussed gentle stretching.  She may use bike shorts for compression.  She may use anti-inflammatories and ice for inflammation.  If she has no improvement in her symptoms over 2-week.  Or if she has worsening symptoms, she will let me know my chart.  . Reviewed expectations re: course of current medical issues. . Discussed self-management of symptoms. . Outlined signs and symptoms indicating need for more acute intervention. . Patient verbalized understanding and all questions were answered. Marland Kitchen Health Maintenance issues including appropriate healthy diet, exercise, and smoking avoidance were discussed with  patient. . See orders for this visit as documented in the electronic medical record. . Patient received an After Visit Summary.  Briscoe Deutscher, DO [place], [place] 07/15/2017  No future appointments.

## 2017-11-28 IMAGING — DX DG SHOULDER 2+V*R*
3 series · 3 of 3 positions shown · non-contrast
Comparison: None.

CLINICAL DATA: Right shoulder pain for 3 weeks

EXAM:
RIGHT SHOULDER - 2+ VIEW

[shoulder grashey ap]
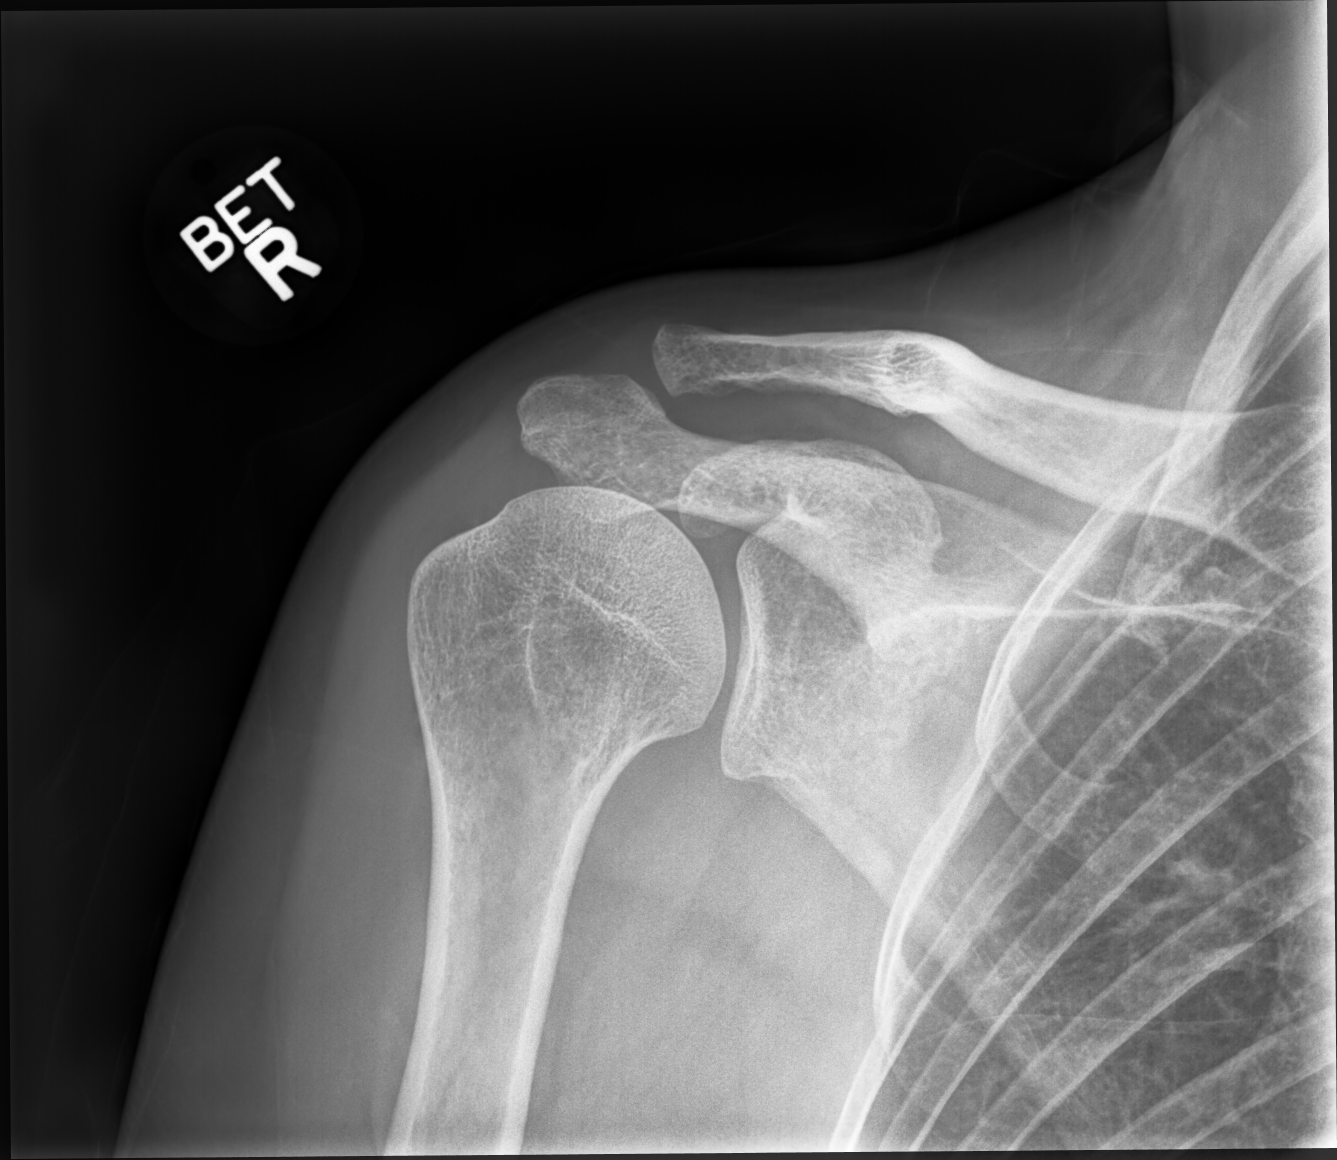

[shoulder y view]
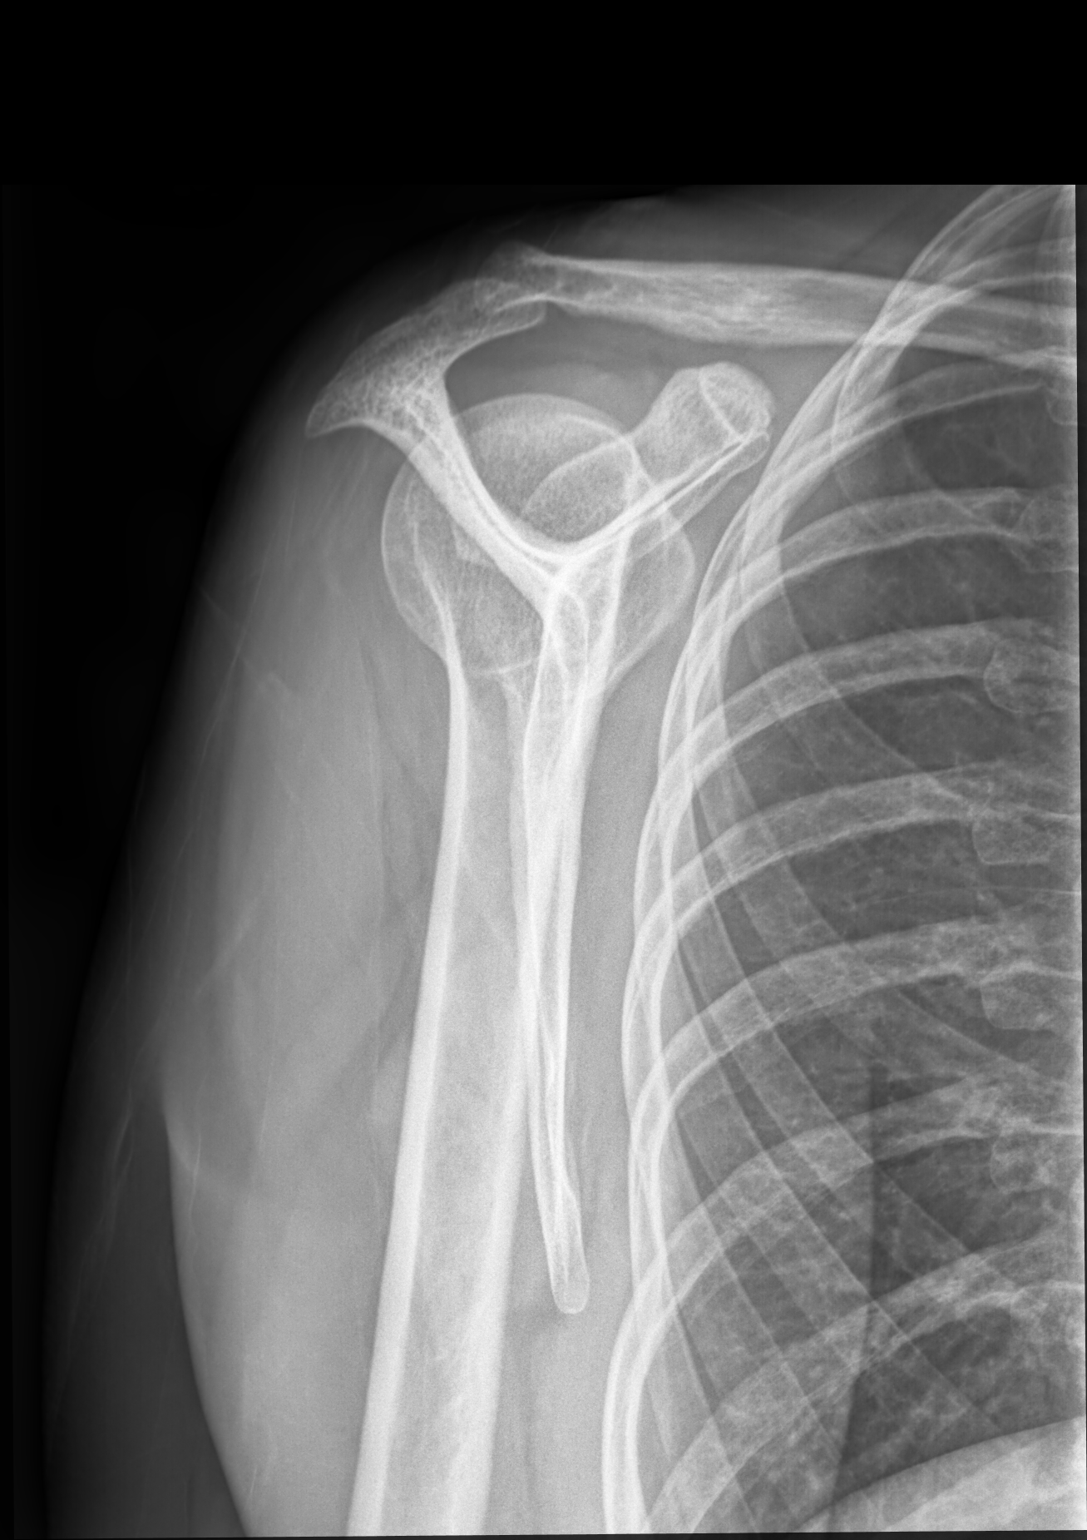

[shoulder axial]
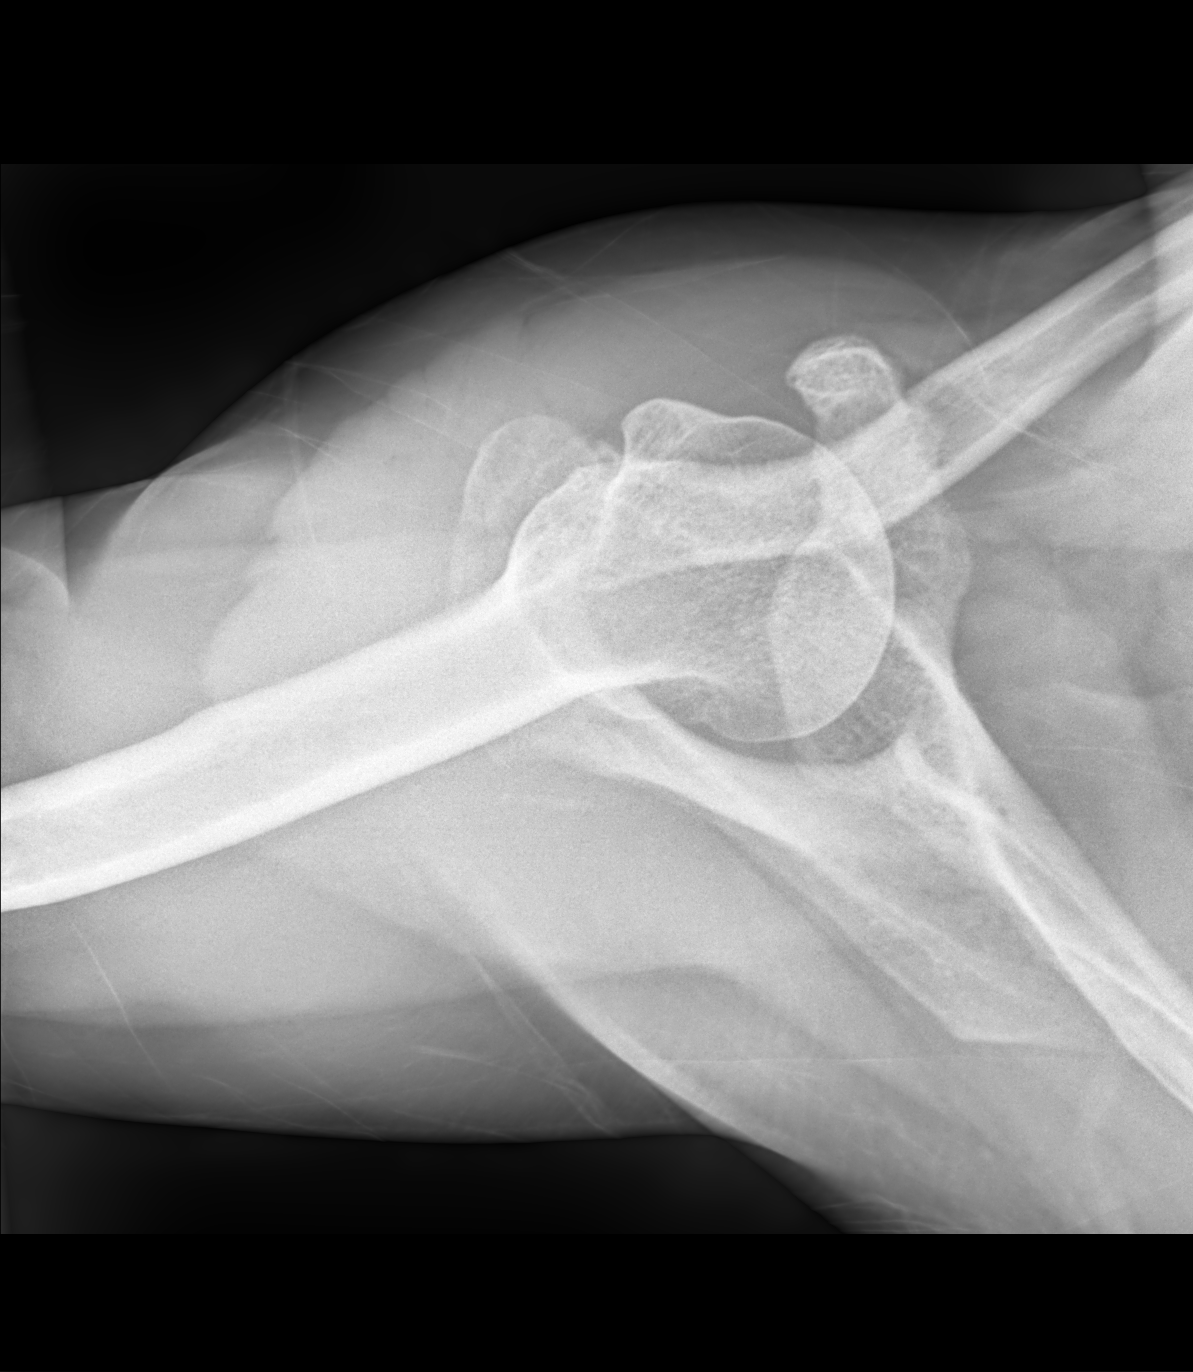

[3 of 3 positions shown; findings below may reference images not displayed]

FINDINGS: No fracture. There is slight superior displacement of the peripheral
clavicle with respect to the acromion. AC joint injury is not
excluded. Visualized right lung is unremarkable.
IMPRESSION: Possible AC joint injury.  No evidence of fracture.

## 2018-03-17 ENCOUNTER — Ambulatory Visit (INDEPENDENT_AMBULATORY_CARE_PROVIDER_SITE_OTHER): Payer: 59

## 2018-03-17 ENCOUNTER — Encounter: Payer: Self-pay | Admitting: Family Medicine

## 2018-03-17 DIAGNOSIS — Z23 Encounter for immunization: Secondary | ICD-10-CM

## 2018-03-17 NOTE — Progress Notes (Signed)
Fluvarix Quad, 0.5 mL given IM, Left Deltoid, MFG: [employer], lot# [id number], exp 10/12/2018, ndc: [id number], pt tolerated well.

## 2018-06-28 ENCOUNTER — Telehealth: Payer: Self-pay | Admitting: Family Medicine

## 2018-06-28 NOTE — Telephone Encounter (Signed)
Spoke to patient.  Seen yesterday 3/15 at Urgent Care and dx w/chicken pox.  Patient was prescribed valacyclovir and told to try Calamine lotions,self isolate, push fluids and rest.  I also advised trying Aveeno oatmeal baths to soothe skin and calm the urge to itch.  Also advised Tylenol/Ibuprofen for discomfort.  Pt verbalized understanding.

## 2018-06-28 NOTE — Telephone Encounter (Signed)
If stable, self-isolate. Clarify - has she had chicken pox in the past? It is viral. Let me know if red flags.

## 2018-06-28 NOTE — Telephone Encounter (Signed)
[facility] Patient Name: Kayla Solis Gender: Female DOB: October 11, 1980  Age: 38 Y 61 M 1 D Return Phone Number: [phone] (Primary) Address:  City/State/Zip: [place]  [address] Client [facility], building services [facility] Night Physician Briscoe Deutscher- DO Contact Type Call Who Is Calling Patient / Member / Family / Caregiver Call Type Triage / Clinical Relationship To Patient Self Return Phone Number [phone] (Primary) Chief Complaint Headache Reason for Call Symptomatic / Request for [place] states she thinks she has chicken pox. She has a sore throat and a headache. She has small red dots that are raised and are spreading. They are on her abdomen, chest, legs, and neck. Translation No Nurse Assessment Nurse: Nicki Reaper, RN, Malachy Mood Date/Time (Eastern Time): 06/26/2018 4:49:49 PM Confirm and document reason for call. If symptomatic, describe symptoms. ---Caller states she may have chicken pox, Thursday she had a headache, sore throat, headache went away, she noticed spots on her stomach, and today rash is located on stomach, neck and L leg, red, raised, looks like pimples shiny on top, spot on neck looks like it has a slight ooze around it, rash is itchy, with no welts, husband had shingles 2 weeks ago, Has the patient traveled to [place], [place], [place], [place], or [place] OR had close contact with a person known to have the novel coronavirus illness in the last 14 days? ---Not Applicable Does the patient have any new or worsening symptoms? ---Yes Will a triage be completed? ---Yes Related visit to physician within the last 2 weeks? ---No Does the PT have any chronic conditions? (i.e. diabetes, asthma, this includes High risk factors for pregnancy, etc.) ---No Is the patient pregnant or possibly  pregnant? (Ask all females between the ages of 40-55) ---No Is this a behavioral health or substance abuse call? ---No Guidelines Guideline Title Affirmed Question Affirmed Notes Nurse Date/Time (Eastern Time) Chickenpox Diagnosed or Suspected Chickenpox began within last 62 hours  Nicki Reaper, Leeann Must 06/26/2018 4:55:53 PM PLEASE NOTE:  All timestamps contained within this report are represented as [place] Standard Time. CONFIDENTIALTY NOTICE: This fax transmission is intended only for the addressee.  It contains information that is legally privileged, confidential or otherwise protected from use or disclosure.  If you are not the intended recipient, you are strictly prohibited from reviewing, disclosing, copying using or disseminating any of this information or taking any action in reliance on or regarding this information.  If you have received this fax in error, please notify us immediately by telephone so that we can arrange for its return to [place]. Phone:  [phone], Toll-Free:  [phone], Fax:  [phone] Page: 2 of 3 Call Id: [id number] Guidelines Guideline Title Affirmed Question Affirmed Notes Nurse Date/Time Eilene Ghazi Time) Rash or Redness Widespread Sore throat  Nicki Reaper, RN, Malachy Mood 06/26/2018 5:20:33 PM Disp. Time Eilene Ghazi Time) Disposition Final User 06/26/2018 4:59:25 PM Call PCP Now Nicki Reaper, RN, Malachy Mood 06/26/2018 5:00:55 PM Paged On Call back to [facility] [place], Malachy Mood 06/26/2018 5:23:12 PM See PCP within 24 Hours Yes Nicki Reaper, RN, Malachy Mood         Caller Disagree/Comply Comply Caller Understands Yes PreDisposition [place] Advice Given Per Guideline CALL PCP NOW: * You need to discuss this with your doctor (or NP/PA). FEVER MEDICINE: * For fever relief, take acetaminophen (e.g., Tylenol). DON'T SCRATCH: * Try not  to scratch. * Scratching makes the itching worse (the 'Itch-Scratch' cycle). * Cut your fingernails short. Wash hands frequently with an antibacterial soap.  This will help prevent a bacterial skin infection. REDUCING THE ITCH - CALAMINE LOTION: * Apply calamine lotion to the chickenpox spots that itch the most. * Or, massage them with an ice cube for 10 minutes. REDUCING THE ITCH - OATMEAL (AVEENO) BATH: * Sprinkle contents of one packet of Aveeno under running faucet with comfortably warm water. * Bathe for 15 - 20 minutes, 1-2 times daily. * Pat dry using towel - do not rub. * For severe itching, take Benadryl (OTC) 4 times per day. * Typical adult dose is 25-50 mg. * FEMALES: For painful vulva ulcers, apply petroleum jelly. For more severe pain, apply 2.5% lidocaine (e.g., OTC Bactine antiseptic-anesthetic). * You can return to work or school after all the sores have crusted over, usually day 6 or 7 of the rash. (Reason: no longer contagious.) * Avoid contact with pregnant women. Avoid contact with anyone who has a weakened immune system (immunocompromised) (e.g., HIV positive, cancer chemotherapy, chronic steroid treatment, splenectomy, organ transplant). CALL BACK IF: * You become worse. SEE PCP WITHIN 24 HOURS: * IF OFFICE WILL BE CLOSED AND NO PCP (PRIMARY CARE PROVIDER) SECONDLEVEL TRIAGE: You need to be seen within the next 24 hours. A clinic or an urgent care center is often a good source of care if your doctor's office is closed or you can't get an appointment. SORE THROAT - For relief of sore throat: * Sip warm chicken broth or apple juice. * Suck on hard candy or a throat lozenge (OTC). CALL BACK IF: * Rash becomes purple or blood-colored or blisterlike * Fever occurs * You become worse. Referrals GO TO FACILITY UNDECIDED Paging DoctorName Phone DateTime Result/Outcome Message Type Notes Loura Pardon - [place] [phone] 06/26/2018 5:00:55 PM Paged On Call Back to Call Center Doctor Paged Please call Access Nurse at [phone] Loura Pardon - MD 06/26/2018 5:20:10 PM Spoke with On Call - General Message Result MD instructed you can't get chicken  pox rom shingles and you will have a rash on abd, chest and back with blisters, pt can go to [facility] PLEASE NOTE:  All timestamps contained within this report are represented as [place] Standard Time. CONFIDENTIALTY NOTICE: This fax transmission is intended only for the addressee.  It contains information that is legally privileged, confidential or otherwise protected from use or disclosure.  If you are not the intended recipient, you are strictly prohibited from reviewing, disclosing, copying using or disseminating any of this information or taking any action in reliance on or regarding this information.  If you have received this fax in error, please notify us immediately by telephone so that we can arrange for its return to [place]. Phone:  [phone], Toll-Free:  [phone], Fax:  [phone] Page: 3 of 3 Call Id: [id number]

## 2018-06-28 NOTE — Telephone Encounter (Signed)
Forwarding to Dr. Juleen China to advise -  Initial Comment Caller states she thinks she has chicken pox. She has a sore throat and a headache. She has small red dots that are raised and are spreading. They are on her abdomen, chest, legs, and neck. Translation No Nurse Assessment Nurse: Nicki Reaper, RN, Malachy Mood Date/Time (Eastern Time): 06/26/2018 4:49:49 PM Confirm and document reason for call. If symptomatic, describe symptoms. ---Caller states she may have chicken pox, Thursday she had a headache, sore throat, headache went away, she noticed spots on her stomach, and today rash is located on stomach, neck and L leg, red, raised, looks like pimples shiny on top, spot on neck looks like it has a slight ooze around it, rash is itchy, with no welts, husband had shingles 2 weeks ago,

## 2018-07-27 ENCOUNTER — Telehealth: Payer: Self-pay | Admitting: Family Medicine

## 2018-07-27 NOTE — Telephone Encounter (Signed)
Pt has not been seen in over a year. Called pt and left vm to schedule doxy.

## 2019-01-28 ENCOUNTER — Encounter: Payer: Self-pay | Admitting: Physician Assistant

## 2019-01-28 ENCOUNTER — Ambulatory Visit (INDEPENDENT_AMBULATORY_CARE_PROVIDER_SITE_OTHER): Payer: 59 | Admitting: Physician Assistant

## 2019-01-28 DIAGNOSIS — F418 Other specified anxiety disorders: Secondary | ICD-10-CM

## 2019-01-28 MED ORDER — SERTRALINE HCL 25 MG PO TABS: 25.0000 mg | ORAL_TABLET | Freq: Every day | ORAL | 0 refills | Status: DC

## 2019-01-28 NOTE — Progress Notes (Signed)
Virtual Visit via Video   I connected with Kayla Solis on 01/28/19 at  3:40 PM EDT by a video enabled telemedicine application and verified that I am speaking with the correct person using two identifiers. Location patient: Home Location provider: [facility], Office Persons participating in the virtual visit: [place], [place] PA-C  I discussed the limitations of evaluation and management by telemedicine and the availability of in person appointments. The patient expressed understanding and agreed to proceed.   Subjective:   HPI:   Patient is present for virtual appointment to discuss transfer care as well as worsening depression and anxiety.  Patient reports that with ongoing caregiver burden, COVID-19, increased work demands, and virtual schooling for children, she is having worsening coping mechanisms and depression.  She states that she is having crying spells daily.  She is starting to have nightmares that are keeping her awake.  She feels exhausted and does not have time to take care of herself.  Her mother-in-law, who lives with her, was recently at [facility], so she has been quarantined since that time and is unable to help with her children.  Patient is going to start a workout program next Monday virtual daily 20-minute classes through the [facility].  Depression screen [facility] 2/9 01/28/2019 02/05/2017  Decreased Interest 3 0  Down, Depressed, Hopeless 3 0  PHQ - 2 Score 6 0  Altered sleeping 2 -  Tired, decreased energy 3 -  Change in appetite 0 -  Feeling bad or failure about yourself  2 -  Trouble concentrating 1 -  Moving slowly or fidgety/restless 0 -  Suicidal thoughts 1 -  PHQ-9 Score 15 -  Difficult doing work/chores Extremely dIfficult -     ROS: See pertinent positives and negatives per HPI.  Patient Active Problem List   Diagnosis Date Noted  . Genetic testing 04/24/2017  . Colon polyp   . Family history of breast cancer   . Family history of  colon cancer   . Screening for cervical cancer 02/05/2017  . Iron deficiency anemia 07/28/2016  . Family history of breast cancer in mother 07/28/2016  . Depression with anxiety 01/15/2010  . Allergic rhinitis 01/06/2008  . Asthma 01/06/2008    Social History   Tobacco Use  . Smoking status: Never Smoker  . Smokeless tobacco: Never Used  Substance Use Topics  . Alcohol use: No    Current Outpatient Medications:  .  sertraline (ZOLOFT) 25 MG tablet, Take 1 tablet (25 mg total) by mouth daily., Disp: 30 tablet, Rfl: 0  No Known Allergies  Objective:   VITALS: Per patient if applicable, see vitals. GENERAL: Alert, appears well; tearful throughout encounter HEENT: Atraumatic, conjunctiva clear, no obvious abnormalities on inspection of external nose and ears. NECK: Normal movements of the head and neck. CARDIOPULMONARY: No increased WOB. Speaking in clear sentences. I:E ratio WNL.  MS: Moves all visible extremities without noticeable abnormality. PSYCH: Pleasant and cooperative, well-groomed. Speech normal rate and rhythm. Affect is appropriate. Insight and judgement are appropriate. Attention is focused, linear, and appropriate.  NEURO: CN grossly intact. Oriented as arrived to appointment on time with no prompting. Moves both UE equally.  SKIN: No obvious lesions, wounds, erythema, or cyanosis noted on face or hands.  Assessment and Plan:   Depression with anxiety Uncontrolled.  Denies any current SI/HI.  We are going to start Zoloft 25 mg daily.  Follow-up with me virtually in 2 weeks.  Recommended that she  discuss that she is starting this medication with her husband, or someone else that she trusts. I discussed with patient that if they develop any SI, to tell someone immediately and seek medical attention.   Encouraged self care as able.  Encouraged that she does participate in new exercise program next week.  We briefly touched on the idea of talk therapy.  She has not had  good experience was in the past, will continue to encourage a follow-up.   . Reviewed expectations re: course of current medical issues. . Discussed self-management of symptoms. . Outlined signs and symptoms indicating need for more acute intervention. . Patient verbalized understanding and all questions were answered. Marland Kitchen Health Maintenance issues including appropriate healthy diet, exercise, and smoking avoidance were discussed with patient. . See orders for this visit as documented in the electronic medical record.  I discussed the assessment and treatment plan with the patient. The patient was provided an opportunity to ask questions and all were answered. The patient agreed with the plan and demonstrated an understanding of the instructions.   The patient was advised to call back or seek an in-person evaluation if the symptoms worsen or if the condition fails to improve as anticipated.   [place], [place] 01/28/2019

## 2019-01-28 NOTE — Assessment & Plan Note (Addendum)
Uncontrolled.  Denies any current SI/HI.  We are going to start Zoloft 25 mg daily.  Follow-up with me virtually in 2 weeks.  Recommended that she discuss that she is starting this medication with her husband, or someone else that she trusts. I discussed with patient that if they develop any SI, to tell someone immediately and seek medical attention.   Encouraged self care as able.  Encouraged that she does participate in new exercise program next week.  We briefly touched on the idea of talk therapy.  She has not had good experience was in the past, will continue to encourage a follow-up.

## 2019-02-14 ENCOUNTER — Ambulatory Visit (INDEPENDENT_AMBULATORY_CARE_PROVIDER_SITE_OTHER): Payer: 59 | Admitting: Physician Assistant

## 2019-02-14 ENCOUNTER — Encounter: Payer: Self-pay | Admitting: Physician Assistant

## 2019-02-14 VITALS — Ht 63.0 in | Wt 140.0 lb

## 2019-02-14 DIAGNOSIS — F418 Other specified anxiety disorders: Secondary | ICD-10-CM | POA: Diagnosis not present

## 2019-02-14 MED ORDER — SERTRALINE HCL 25 MG PO TABS: 25.0000 mg | ORAL_TABLET | Freq: Every day | ORAL | 0 refills | Status: DC

## 2019-02-14 NOTE — Progress Notes (Signed)
Virtual Visit via Video   I connected with Kayla Solis on 02/14/19 at 11:40 AM EST by a video enabled telemedicine application and verified that I am speaking with the correct person using two identifiers. Location patient: Home Location provider: [facility], Office Persons participating in the virtual visit: LOURENE MULHERN, Inda Coke PA-C, Anselmo Pickler, LPN   I discussed the limitations of evaluation and management by telemedicine and the availability of in person appointments. The patient expressed understanding and agreed to proceed.  I acted as a Education administrator for [employer], PA-C [employer], LPN  Subjective:   HPI:   Depression Pt following up today, last visit 2 weeks ago 10/16 and was started on Zoloft 25 mg daily. Pt says she feels a little better. She did have nausea for the 1st week or so but that has resolved.  Denies any headaches, suicidal ideation, homicidal ideation, poor appetite, fatigue.  She states that her sleeping has improved and she is not having any more nightmares, she is still waking up occasionally.  ROS: See pertinent positives and negatives per HPI.  Patient Active Problem List   Diagnosis Date Noted  . Genetic testing 04/24/2017  . Colon polyp   . Family history of breast cancer   . Family history of colon cancer   . Screening for cervical cancer 02/05/2017  . Iron deficiency anemia 07/28/2016  . Family history of breast cancer in mother 07/28/2016  . Depression with anxiety 01/15/2010  . Allergic rhinitis 01/06/2008  . Asthma 01/06/2008    Social History   Tobacco Use  . Smoking status: Never Smoker  . Smokeless tobacco: Never Used  Substance Use Topics  . Alcohol use: No    Current Outpatient Medications:  .  sertraline (ZOLOFT) 25 MG tablet, Take 1 tablet (25 mg total) by mouth daily., Disp: 30 tablet, Rfl: 0  No Known Allergies  Objective:   VITALS: Per patient if applicable, see vitals. GENERAL: Alert, appears well  and in no acute distress. HEENT: Atraumatic, conjunctiva clear, no obvious abnormalities on inspection of external nose and ears. NECK: Normal movements of the head and neck. CARDIOPULMONARY: No increased WOB. Speaking in clear sentences. I:E ratio WNL.  MS: Moves all visible extremities without noticeable abnormality. PSYCH: Pleasant and cooperative, well-groomed. Speech normal rate and rhythm. Affect is appropriate. Insight and judgement are appropriate. Attention is focused, linear, and appropriate.  NEURO: CN grossly intact. Oriented as arrived to appointment on time with no prompting. Moves both UE equally.  SKIN: No obvious lesions, wounds, erythema, or cyanosis noted on face or hands.  Assessment and Plan:   Yarieliz was seen today for depression.  Diagnoses and all orders for this visit:  Depression with anxiety   Improved.  Given that she is only been on the medication for 2 to 3 weeks, will go ahead and continue Zoloft 25 mg at this dosage for 3 more months.  I did tell her that if she would like to either come off the medication or increase the medication to schedule a visit in the interim.  Patient verbalized understanding to plan. I discussed with patient that if they develop any SI, to tell someone immediately and seek medical attention.   . Reviewed expectations re: course of current medical issues. . Discussed self-management of symptoms. . Outlined signs and symptoms indicating need for more acute intervention. . Patient verbalized understanding and all questions were answered. Marland Kitchen Health Maintenance issues including appropriate healthy diet, exercise, and  smoking avoidance were discussed with patient. . See orders for this visit as documented in the electronic medical record.  I discussed the assessment and treatment plan with the patient. The patient was provided an opportunity to ask questions and all were answered. The patient agreed with the plan and demonstrated an  understanding of the instructions.   The patient was advised to call back or seek an in-person evaluation if the symptoms worsen or if the condition fails to improve as anticipated.   CMA or LPN served as scribe during this visit. History, Physical, and Plan performed by medical provider. The above documentation has been reviewed and is accurate and complete.   [place], [place] 02/14/2019

## 2019-02-15 ENCOUNTER — Other Ambulatory Visit: Payer: Self-pay

## 2019-02-16 ENCOUNTER — Ambulatory Visit (INDEPENDENT_AMBULATORY_CARE_PROVIDER_SITE_OTHER): Payer: 59

## 2019-02-16 ENCOUNTER — Encounter: Payer: Self-pay | Admitting: Physician Assistant

## 2019-02-16 DIAGNOSIS — Z23 Encounter for immunization: Secondary | ICD-10-CM | POA: Diagnosis not present

## 2019-02-23 ENCOUNTER — Encounter: Payer: Self-pay | Admitting: Physician Assistant

## 2019-02-23 NOTE — Telephone Encounter (Signed)
Please call pt and schedule appt for Friday can be virtual per [facility].

## 2019-02-24 ENCOUNTER — Telehealth: Payer: Self-pay | Admitting: Physician Assistant

## 2019-02-24 NOTE — Telephone Encounter (Signed)
She will take her last Sertraline on Saturday.  She has an appt scheduled on Monday morning so she can get a refill.  She is worried about missing a day.  Wants a call to see if anything can be done / advise if it is ok to miss one dose.  She is available to talk tomorrow morning up until 9:30 am or between 12-1.

## 2019-02-25 ENCOUNTER — Ambulatory Visit: Payer: 59 | Admitting: Physician Assistant

## 2019-02-25 MED ORDER — SERTRALINE HCL 25 MG PO TABS: 25.0000 mg | ORAL_TABLET | Freq: Every day | ORAL | 0 refills | Status: DC

## 2019-02-25 NOTE — Telephone Encounter (Signed)
Spoke to pt told her Rx refill for Sertraline was sent to pharmacy. Pt verbalized understanding.

## 2019-02-25 NOTE — Addendum Note (Signed)
Addended by: Marian Sorrow on: 02/25/2019 09:33 AM   Modules accepted: Orders

## 2019-02-28 ENCOUNTER — Encounter: Payer: Self-pay | Admitting: Physician Assistant

## 2019-02-28 ENCOUNTER — Ambulatory Visit (INDEPENDENT_AMBULATORY_CARE_PROVIDER_SITE_OTHER): Payer: 59 | Admitting: Physician Assistant

## 2019-02-28 DIAGNOSIS — F418 Other specified anxiety disorders: Secondary | ICD-10-CM

## 2019-02-28 MED ORDER — BUPROPION HCL 75 MG PO TABS: 75.0000 mg | ORAL_TABLET | Freq: Two times a day (BID) | ORAL | 0 refills | Status: DC

## 2019-02-28 NOTE — Progress Notes (Signed)
Virtual Visit via Video   I connected with Kayla Solis on 02/28/19 at  8:20 AM EST by a video enabled telemedicine application and verified that I am speaking with the correct person using two identifiers. Location patient: Home Location provider: [facility], Office Persons participating in the virtual visit: EBBA KLOOS, Inda Coke PA-C, Anselmo Pickler, LPN   I discussed the limitations of evaluation and management by telemedicine and the availability of in person appointments. The patient expressed understanding and agreed to proceed.  I acted as a Education administrator for [employer], PA-C [employer], LPN  Subjective:   HPI:   Depression Patient started on Zoloft on 01/28/19. She is currently on 25 mg Zoloft daily. Pt said she was doing better till last week, a lot of changes at home.  She states that she has continued to 25 mg Zoloft daily but does not feel like it is helping.  She states that her mood is not worse than it was when she first started the medication, but she is facing another bout of depression.  Denies any suicidal plans.  She is interested in seeing a therapist.   Depression screen [facility] 2/9 02/28/2019 02/14/2019 01/28/2019 02/05/2017  Decreased Interest 3 1 3  0  Down, Depressed, Hopeless 2 1 3  0  PHQ - 2 Score 5 2 6  0  Altered sleeping 3 2 2  -  Tired, decreased energy 3 3 3  -  Change in appetite 0 0 0 -  Feeling bad or failure about yourself  2 1 2  -  Trouble concentrating 0 0 1 -  Moving slowly or fidgety/restless 0 0 0 -  Suicidal thoughts 1 0 1 -  PHQ-9 Score 14 8 15  -  Difficult doing work/chores Somewhat difficult Somewhat difficult Extremely dIfficult -     ROS: See pertinent positives and negatives per HPI.  Patient Active Problem List   Diagnosis Date Noted  . Genetic testing 04/24/2017  . Colon polyp   . Family history of breast cancer   . Family history of colon cancer   . Screening for cervical cancer 02/05/2017  . Iron deficiency  anemia 07/28/2016  . Family history of breast cancer in mother 07/28/2016  . Depression with anxiety 01/15/2010  . Allergic rhinitis 01/06/2008  . Asthma 01/06/2008    Social History   Tobacco Use  . Smoking status: Never Smoker  . Smokeless tobacco: Never Used  Substance Use Topics  . Alcohol use: No    Current Outpatient Medications:  .  sertraline (ZOLOFT) 25 MG tablet, Take 1 tablet (25 mg total) by mouth daily., Disp: 90 tablet, Rfl: 0 .  buPROPion (WELLBUTRIN) 75 MG tablet, Take 1 tablet (75 mg total) by mouth 2 (two) times daily., Disp: 60 tablet, Rfl: 0  No Known Allergies  Objective:   VITALS: Per patient if applicable, see vitals. GENERAL: Alert, appears well and in no acute distress. HEENT: Atraumatic, conjunctiva clear, no obvious abnormalities on inspection of external nose and ears. NECK: Normal movements of the head and neck. CARDIOPULMONARY: No increased WOB. Speaking in clear sentences. I:E ratio WNL.  MS: Moves all visible extremities without noticeable abnormality. PSYCH: Tearful and cooperative, well-groomed. Speech normal rate and rhythm. Affect is appropriate. Insight and judgement are appropriate. Attention is focused, linear, and appropriate.  NEURO: CN grossly intact. Oriented as arrived to appointment on time with no prompting. Moves both UE equally.  SKIN: No obvious lesions, wounds, erythema, or cyanosis noted on face or hands.  Assessment and Plan:   Depression with anxiety Uncontrolled.  We are going to continue Zoloft 25 mg daily and start Wellbutrin 75 mg twice daily.  Follow-up with me in 1 week, sooner if concerns.  I also did reach out to our office therapist, Trey Paula, to contact her for an appointment. I discussed with patient that if they develop any SI, to tell someone immediately and seek medical attention.   . Reviewed expectations re: course of current medical issues. . Discussed self-management of symptoms. . Outlined signs and  symptoms indicating need for more acute intervention. . Patient verbalized understanding and all questions were answered. Marland Kitchen Health Maintenance issues including appropriate healthy diet, exercise, and smoking avoidance were discussed with patient. . See orders for this visit as documented in the electronic medical record.  I discussed the assessment and treatment plan with the patient. The patient was provided an opportunity to ask questions and all were answered. The patient agreed with the plan and demonstrated an understanding of the instructions.   The patient was advised to call back or seek an in-person evaluation if the symptoms worsen or if the condition fails to improve as anticipated.   CMA or LPN served as scribe during this visit. History, Physical, and Plan performed by medical provider. The above documentation has been reviewed and is accurate and complete.  [place], [place] 02/28/2019

## 2019-02-28 NOTE — Assessment & Plan Note (Signed)
Uncontrolled.  We are going to continue Zoloft 25 mg daily and start Wellbutrin 75 mg twice daily.  Follow-up with me in 1 week, sooner if concerns.  I also did reach out to our office therapist, Trey Paula, to contact her for an appointment. I discussed with patient that if they develop any SI, to tell someone immediately and seek medical attention.

## 2019-03-04 ENCOUNTER — Encounter: Payer: Self-pay | Admitting: Physician Assistant

## 2019-03-04 ENCOUNTER — Ambulatory Visit (INDEPENDENT_AMBULATORY_CARE_PROVIDER_SITE_OTHER): Payer: 59 | Admitting: Physician Assistant

## 2019-03-04 VITALS — Ht 63.0 in | Wt 140.0 lb

## 2019-03-04 DIAGNOSIS — F418 Other specified anxiety disorders: Secondary | ICD-10-CM

## 2019-03-04 NOTE — Progress Notes (Signed)
Virtual Visit via Video   I connected with Kayla Solis on 03/04/19 at  9:20 AM EST by a video enabled telemedicine application and verified that I am speaking with the correct person using two identifiers. Location patient: Home Location provider: [facility], Office Persons participating in the virtual visit: Kayla Solis, Inda Coke PA-C, Anselmo Pickler, LPN   I discussed the limitations of evaluation and management by telemedicine and the availability of in person appointments. The patient expressed understanding and agreed to proceed.  I acted as a Education administrator for [employer], PA-C [employer], LPN  Subjective:   HPI:   Depression with anxiety Pt following up today, last visit 11/16 was started on Wellbutrin 75 mg twice a day along with Zoloft 25 mg daily. Pt is feeling much better.  Denies SI/HI.  The only problem that she is experiencing is having some issues with sleeping, states that she is waking up at midnight and is awake for 2 hours.  She is taking the medication 8 AM and 8 PM.  Patient reports that she feels safe at home and her kids are safe at home as well.  Depression screen [facility] 2/9 03/04/2019 02/28/2019 02/14/2019 01/28/2019 02/05/2017  Decreased Interest 0 3 1 3  0  Down, Depressed, Hopeless 0 2 1 3  0  PHQ - 2 Score 0 5 2 6  0  Altered sleeping 2 3 2 2  -  Tired, decreased energy 1 3 3 3  -  Change in appetite 0 0 0 0 -  Feeling bad or failure about yourself  1 2 1 2  -  Trouble concentrating 0 0 0 1 -  Moving slowly or fidgety/restless 0 0 0 0 -  Suicidal thoughts 0 1 0 1 -  PHQ-9 Score 4 14 8 15  -  Difficult doing work/chores Not difficult at all Somewhat difficult Somewhat difficult Extremely dIfficult -     ROS: See pertinent positives and negatives per HPI.  Patient Active Problem List   Diagnosis Date Noted  . Genetic testing 04/24/2017  . Colon polyp   . Family history of breast cancer   . Family history of colon cancer   . Iron  deficiency anemia 07/28/2016  . Family history of breast cancer in mother 07/28/2016  . Depression with anxiety 01/15/2010  . Allergic rhinitis 01/06/2008  . Asthma 01/06/2008    Social History   Tobacco Use  . Smoking status: Never Smoker  . Smokeless tobacco: Never Used  Substance Use Topics  . Alcohol use: No    Current Outpatient Medications:  .  buPROPion (WELLBUTRIN) 75 MG tablet, Take 1 tablet (75 mg total) by mouth 2 (two) times daily., Disp: 60 tablet, Rfl: 0 .  sertraline (ZOLOFT) 25 MG tablet, Take 1 tablet (25 mg total) by mouth daily., Disp: 90 tablet, Rfl: 0  No Known Allergies  Objective:   VITALS: Per patient if applicable, see vitals. GENERAL: Alert, appears well and in no acute distress. HEENT: Atraumatic, conjunctiva clear, no obvious abnormalities on inspection of external nose and ears. NECK: Normal movements of the head and neck. CARDIOPULMONARY: No increased WOB. Speaking in clear sentences. I:E ratio WNL.  MS: Moves all visible extremities without noticeable abnormality. PSYCH: Pleasant and cooperative, well-groomed. Speech normal rate and rhythm. Affect is appropriate. Insight and judgement are appropriate. Attention is focused, linear, and appropriate.  NEURO: CN grossly intact. Oriented as arrived to appointment on time with no prompting. Moves both UE equally.  SKIN: No obvious lesions,  wounds, erythema, or cyanosis noted on face or hands.  Assessment and Plan:   Jessiah was seen today for depression and anxiety.  Diagnoses and all orders for this visit:  Depression with anxiety   Patient is doing significantly better with her current regimen of Wellbutrin 75 mg daily and Zoloft 25 mg daily.  Denies any SI or HI.  I recommended that she send me a MyChart message when she is done with this prescription of Wellbutrin and let me know if she wants to take the extended release version.  At this time, we will have her follow-up with me in 3 months.  In  order to finish out her prescription, I did recommend that she bump up the second medication administration time to closer to 4 PM.  Patient verbalized understanding to plan.  . Reviewed expectations re: course of current medical issues. . Discussed self-management of symptoms. . Outlined signs and symptoms indicating need for more acute intervention. . Patient verbalized understanding and all questions were answered. Marland Kitchen Health Maintenance issues including appropriate healthy diet, exercise, and smoking avoidance were discussed with patient. . See orders for this visit as documented in the electronic medical record.  I discussed the assessment and treatment plan with the patient. The patient was provided an opportunity to ask questions and all were answered. The patient agreed with the plan and demonstrated an understanding of the instructions.   The patient was advised to call back or seek an in-person evaluation if the symptoms worsen or if the condition fails to improve as anticipated.   CMA or LPN served as scribe during this visit. History, Physical, and Plan performed by medical provider. The above documentation has been reviewed and is accurate and complete.  [place], [place] 03/04/2019

## 2019-03-23 ENCOUNTER — Other Ambulatory Visit: Payer: Self-pay | Admitting: Physician Assistant

## 2019-03-23 MED ORDER — BUPROPION HCL 75 MG PO TABS: 75.0000 mg | ORAL_TABLET | Freq: Two times a day (BID) | ORAL | 0 refills | Status: DC

## 2019-03-23 MED ORDER — SERTRALINE HCL 25 MG PO TABS: 25.0000 mg | ORAL_TABLET | Freq: Every day | ORAL | 0 refills | Status: DC

## 2019-04-06 ENCOUNTER — Ambulatory Visit (INDEPENDENT_AMBULATORY_CARE_PROVIDER_SITE_OTHER): Payer: 59 | Admitting: Psychology

## 2019-04-06 DIAGNOSIS — F411 Generalized anxiety disorder: Secondary | ICD-10-CM | POA: Diagnosis not present

## 2019-04-19 ENCOUNTER — Ambulatory Visit: Payer: 59 | Admitting: Psychology

## 2019-05-03 ENCOUNTER — Ambulatory Visit (INDEPENDENT_AMBULATORY_CARE_PROVIDER_SITE_OTHER): Payer: 59 | Admitting: Psychology

## 2019-05-03 DIAGNOSIS — F411 Generalized anxiety disorder: Secondary | ICD-10-CM

## 2019-05-30 ENCOUNTER — Ambulatory Visit (INDEPENDENT_AMBULATORY_CARE_PROVIDER_SITE_OTHER): Payer: 59 | Admitting: Psychology

## 2019-05-30 DIAGNOSIS — F411 Generalized anxiety disorder: Secondary | ICD-10-CM

## 2019-06-14 ENCOUNTER — Ambulatory Visit (INDEPENDENT_AMBULATORY_CARE_PROVIDER_SITE_OTHER): Payer: 59 | Admitting: Psychology

## 2019-06-14 DIAGNOSIS — F411 Generalized anxiety disorder: Secondary | ICD-10-CM | POA: Diagnosis not present

## 2019-06-15 ENCOUNTER — Other Ambulatory Visit: Payer: Self-pay | Admitting: Physician Assistant

## 2019-06-16 ENCOUNTER — Encounter: Payer: Self-pay | Admitting: Physician Assistant

## 2019-06-17 ENCOUNTER — Encounter: Payer: Self-pay | Admitting: Physician Assistant

## 2019-06-17 ENCOUNTER — Ambulatory Visit (INDEPENDENT_AMBULATORY_CARE_PROVIDER_SITE_OTHER): Payer: 59 | Admitting: Physician Assistant

## 2019-06-17 VITALS — Ht 63.0 in | Wt 140.0 lb

## 2019-06-17 DIAGNOSIS — F418 Other specified anxiety disorders: Secondary | ICD-10-CM

## 2019-06-17 MED ORDER — BUPROPION HCL ER (XL) 150 MG PO TB24: ORAL_TABLET | ORAL | 0 refills | Status: DC

## 2019-06-17 MED ORDER — SERTRALINE HCL 50 MG PO TABS: 50.0000 mg | ORAL_TABLET | Freq: Every day | ORAL | 0 refills | Status: DC

## 2019-06-17 NOTE — Progress Notes (Signed)
Virtual Visit via Video   I connected with Kayla Solis on 06/17/19 at  1:00 PM EST by a video enabled telemedicine application and verified that I am speaking with the correct person using two identifiers. Location patient: Home Location provider: [facility], Office Persons participating in the virtual visit: SUZY PONTE, Inda Coke PA-C, Anselmo Pickler, LPN   I discussed the limitations of evaluation and management by telemedicine and the availability of in person appointments. The patient expressed understanding and agreed to proceed.  I acted as a Education administrator for [employer], PA-C [employer], LPN  Subjective:   HPI:   Anxiety & Depression Pt following up today, currently taking Wellbutrin 75 mg twice a day and Zoloft 25 mg daily. Pt is having trouble sleeping has increased the past 2 weeks. Wakes up and can't fall back to sleep. Denies SI/HI. She has been seeing Trey Paula, LCSW for her mood, who has suggested she reach out to me for possible medication increase.  Depression screen [facility] 2/9 06/17/2019 03/04/2019 02/28/2019 02/14/2019 01/28/2019  Decreased Interest 1 0 3 1 3   Down, Depressed, Hopeless 1 0 2 1 3   PHQ - 2 Score 2 0 5 2 6   Altered sleeping 2 2 3 2 2   Tired, decreased energy 3 1 3 3 3   Change in appetite 0 0 0 0 0  Feeling bad or failure about yourself  1 1 2 1 2   Trouble concentrating 0 0 0 0 1  Moving slowly or fidgety/restless 0 0 0 0 0  Suicidal thoughts 0 0 1 0 1  PHQ-9 Score 8 4 14 8 15   Difficult doing work/chores Somewhat difficult Not difficult at all Somewhat difficult Somewhat difficult Extremely dIfficult     ROS: See pertinent positives and negatives per HPI.  Patient Active Problem List   Diagnosis Date Noted  . Genetic testing 04/24/2017  . Colon polyp   . Family history of breast cancer   . Family history of colon cancer   . Iron deficiency anemia 07/28/2016  . Family history of breast cancer in mother 07/28/2016  . Depression  with anxiety 01/15/2010  . Allergic rhinitis 01/06/2008  . Asthma 01/06/2008    Social History   Tobacco Use  . Smoking status: Never Smoker  . Smokeless tobacco: Never Used  Substance Use Topics  . Alcohol use: No    Current Outpatient Medications:  .  buPROPion (WELLBUTRIN) 75 MG tablet, TAKE 1 TABLET BY MOUTH TWICE A DAY, Disp: 60 tablet, Rfl: 0 .  sertraline (ZOLOFT) 25 MG tablet, Take 1 tablet (25 mg total) by mouth daily., Disp: 90 tablet, Rfl: 0  No Known Allergies  Objective:   VITALS: Per patient if applicable, see vitals. GENERAL: Alert, appears well and in no acute distress. HEENT: Atraumatic, conjunctiva clear, no obvious abnormalities on inspection of external nose and ears. NECK: Normal movements of the head and neck. CARDIOPULMONARY: No increased WOB. Speaking in clear sentences. I:E ratio WNL.  MS: Moves all visible extremities without noticeable abnormality. PSYCH: Pleasant and cooperative, well-groomed. Speech normal rate and rhythm. Affect is appropriate. Insight and judgement are appropriate. Attention is focused, linear, and appropriate.  NEURO: CN grossly intact. Oriented as arrived to appointment on time with no prompting. Moves both UE equally.  SKIN: No obvious lesions, wounds, erythema, or cyanosis noted on face or hands.  Assessment and Plan:   Kayla Solis was seen today for anxiety and depression.  Diagnoses and all orders for this  visit:  Depression with anxiety   Uncontrolled. We are going to increase her Zoloft from 25 mg daily to 50 mg daily.  We are also going to change her Wellbutrin to 150 mg daily extended release in case her current BID afternoon dosing is contributing to her insomnia.  I recommended that she follow-up with me in 1 month, sooner if concerns.  Appointment has been made.  Continue to see Trey Paula for therapy.  . Reviewed expectations re: course of current medical issues. . Discussed self-management of symptoms. . Outlined  signs and symptoms indicating need for more acute intervention. . Patient verbalized understanding and all questions were answered. Marland Kitchen Health Maintenance issues including appropriate healthy diet, exercise, and smoking avoidance were discussed with patient. . See orders for this visit as documented in the electronic medical record.  I discussed the assessment and treatment plan with the patient. The patient was provided an opportunity to ask questions and all were answered. The patient agreed with the plan and demonstrated an understanding of the instructions.   The patient was advised to call back or seek an in-person evaluation if the symptoms worsen or if the condition fails to improve as anticipated.   CMA or LPN served as scribe during this visit. History, Physical, and Plan performed by medical provider. The above documentation has been reviewed and is accurate and complete.   [place], [place] 06/17/2019

## 2019-06-17 NOTE — Telephone Encounter (Signed)
Called and scheduled virtual appt

## 2019-07-27 ENCOUNTER — Ambulatory Visit (INDEPENDENT_AMBULATORY_CARE_PROVIDER_SITE_OTHER): Payer: 59 | Admitting: Psychology

## 2019-07-27 DIAGNOSIS — F411 Generalized anxiety disorder: Secondary | ICD-10-CM | POA: Diagnosis not present

## 2019-07-29 ENCOUNTER — Telehealth (INDEPENDENT_AMBULATORY_CARE_PROVIDER_SITE_OTHER): Payer: 59 | Admitting: Physician Assistant

## 2019-07-29 ENCOUNTER — Other Ambulatory Visit: Payer: Self-pay

## 2019-07-29 ENCOUNTER — Encounter: Payer: Self-pay | Admitting: Physician Assistant

## 2019-07-29 VITALS — Ht 63.0 in | Wt 140.0 lb

## 2019-07-29 DIAGNOSIS — F418 Other specified anxiety disorders: Secondary | ICD-10-CM

## 2019-07-29 MED ORDER — BUPROPION HCL ER (XL) 150 MG PO TB24: ORAL_TABLET | ORAL | 1 refills | Status: DC

## 2019-07-29 MED ORDER — SERTRALINE HCL 50 MG PO TABS: 50.0000 mg | ORAL_TABLET | Freq: Every day | ORAL | 1 refills | Status: DC

## 2019-07-29 NOTE — Progress Notes (Signed)
Virtual Visit via Video   I connected with Kayla Solis on 07/29/19 at 12:00 PM EDT by a video enabled telemedicine application and verified that I am speaking with the correct person using two identifiers. Location patient: Home Location provider: [facility], Office Persons participating in the virtual visit: JEYDY PACHUTA, Inda Coke PA-C, Anselmo Pickler, LPN   I discussed the limitations of evaluation and management by telemedicine and the availability of in person appointments. The patient expressed understanding and agreed to proceed.  I acted as a Education administrator for [employer], PA-C [employer], LPN  Subjective:   HPI:   Anxiety & Depression Pt following up, currently taking Wellbutrin XL 150 mg daily and Zoloft  50 mg daily. Tolerating medications and feels they are helping. Denies SI/HI.  Patient continues to do talk therapy with Trey Paula.  Patient also reports that she is sleeping better.  GAD 7 : Generalized Anxiety Score 07/29/2019 06/17/2019  Nervous, Anxious, on Edge 1 2  Control/stop worrying 1 2  Worry too much - different things 1 2  Trouble relaxing 1 2  Restless 0 0  Easily annoyed or irritable 1 1  Afraid - awful might happen 1 2  Total GAD 7 Score 6 11  Anxiety Difficulty Somewhat difficult Somewhat difficult    Depression screen [facility] 2/9 07/29/2019 06/17/2019 03/04/2019 02/28/2019 02/14/2019  Decreased Interest 0 1 0 3 1  Down, Depressed, Hopeless 0 1 0 2 1  PHQ - 2 Score 0 2 0 5 2  Altered sleeping 0 2 2 3 2   Tired, decreased energy 1 3 1 3 3   Change in appetite 0 0 0 0 0  Feeling bad or failure about yourself  0 1 1 2 1   Trouble concentrating 0 0 0 0 0  Moving slowly or fidgety/restless 0 0 0 0 0  Suicidal thoughts 0 0 0 1 0  PHQ-9 Score 1 8 4 14 8   Difficult doing work/chores Somewhat difficult Somewhat difficult Not difficult at all Somewhat difficult Somewhat difficult     Patient Active Problem List   Diagnosis Date Noted  .  Genetic testing 04/24/2017  . Colon polyp   . Family history of breast cancer   . Family history of colon cancer   . Iron deficiency anemia 07/28/2016  . Family history of breast cancer in mother 07/28/2016  . Depression with anxiety 01/15/2010  . Allergic rhinitis 01/06/2008  . Asthma 01/06/2008    Social History   Tobacco Use  . Smoking status: Never Smoker  . Smokeless tobacco: Never Used  Substance Use Topics  . Alcohol use: No    Current Outpatient Medications:  .  buPROPion (WELLBUTRIN XL) 150 MG 24 hr tablet, Take 150 mg tablet daily., Disp: 90 tablet, Rfl: 0 .  sertraline (ZOLOFT) 50 MG tablet, Take 1 tablet (50 mg total) by mouth daily., Disp: 90 tablet, Rfl: 0  No Known Allergies  Objective:   VITALS: Per patient if applicable, see vitals. GENERAL: Alert, appears well and in no acute distress. HEENT: Atraumatic, conjunctiva clear, no obvious abnormalities on inspection of external nose and ears. NECK: Normal movements of the head and neck. CARDIOPULMONARY: No increased WOB. Speaking in clear sentences. I:E ratio WNL.  MS: Moves all visible extremities without noticeable abnormality. PSYCH: Pleasant and cooperative, well-groomed. Speech normal rate and rhythm. Affect is appropriate. Insight and judgement are appropriate. Attention is focused, linear, and appropriate.  NEURO: CN grossly intact. Oriented as arrived to appointment  on time with no prompting. Moves both UE equally.  SKIN: No obvious lesions, wounds, erythema, or cyanosis noted on face or hands.  Assessment and Plan:   Nychelle was seen today for anxiety and depression.  Diagnoses and all orders for this visit:  Depression with anxiety  Improved.  We will continue current regimen of Zoloft 50 mg and Wellbutrin 150 mg daily.  Follow-up in 6 months, sooner if concerns.  . Reviewed expectations re: course of current medical issues. . Discussed self-management of symptoms. . Outlined signs and symptoms  indicating need for more acute intervention. . Patient verbalized understanding and all questions were answered. Marland Kitchen Health Maintenance issues including appropriate healthy diet, exercise, and smoking avoidance were discussed with patient. . See orders for this visit as documented in the electronic medical record.  I discussed the assessment and treatment plan with the patient. The patient was provided an opportunity to ask questions and all were answered. The patient agreed with the plan and demonstrated an understanding of the instructions.   The patient was advised to call back or seek an in-person evaluation if the symptoms worsen or if the condition fails to improve as anticipated.   CMA or LPN served as scribe during this visit. History, Physical, and Plan performed by medical provider. The above documentation has been reviewed and is accurate and complete.   [place], [place] 07/29/2019

## 2019-08-16 ENCOUNTER — Encounter: Payer: Self-pay | Admitting: Physician Assistant

## 2019-08-16 ENCOUNTER — Telehealth (INDEPENDENT_AMBULATORY_CARE_PROVIDER_SITE_OTHER): Payer: 59 | Admitting: Physician Assistant

## 2019-08-16 DIAGNOSIS — L247 Irritant contact dermatitis due to plants, except food: Secondary | ICD-10-CM | POA: Diagnosis not present

## 2019-08-16 MED ORDER — PREDNISONE 10 MG PO TABS: ORAL_TABLET | ORAL | 0 refills | Status: DC

## 2019-08-16 NOTE — Progress Notes (Signed)
Virtual Visit via Video   I connected with Kayla Solis on 08/16/19 at 12:30 PM EDT by a video enabled telemedicine application and verified that I am speaking with the correct person using two identifiers. Location patient: Home Location provider: [facility], Office Persons participating in the virtual visit: Kayla Solis, [place] PA-C  I discussed the limitations of evaluation and management by telemedicine and the availability of in person appointments. The patient expressed understanding and agreed to proceed.  Subjective:   HPI:   Rash Patient reports that over the past two weeks she has been doing yard work. She has never had poison ivy before but thinks that she may have been exposed to it. Red, blistery, itchy rash on bilateral forearms has spread to waist and bra line. Area weeping clear fluid at times.  Denies: fevers, chills, malaise, purulent discharge, pain  Has tried topical calamine lotion only.  ROS: See pertinent positives and negatives per HPI.  Patient Active Problem List   Diagnosis Date Noted  . Genetic testing 04/24/2017  . Colon polyp   . Family history of colon cancer   . Iron deficiency anemia 07/28/2016  . Family history of breast cancer in mother 07/28/2016  . Gestational diabetes mellitus 08/10/2015  . Depression with anxiety 01/15/2010  . Allergic rhinitis 01/06/2008  . Asthma 01/06/2008    Social History   Tobacco Use  . Smoking status: Never Smoker  . Smokeless tobacco: Never Used  Substance Use Topics  . Alcohol use: No    Current Outpatient Medications:  .  buPROPion (WELLBUTRIN XL) 150 MG 24 hr tablet, Take 150 mg tablet daily., Disp: 90 tablet, Rfl: 1 .  predniSONE (DELTASONE) 10 MG tablet, Take two tablets daily x 1 week, then one tablet daily x 1 week., Disp: 21 tablet, Rfl: 0 .  sertraline (ZOLOFT) 50 MG tablet, Take 1 tablet (50 mg total) by mouth daily., Disp: 90 tablet, Rfl: 1  No Known Allergies  Objective:   VITALS: Per patient if applicable, see vitals. GENERAL: Alert, appears well and in no acute distress. HEENT: Atraumatic, conjunctiva clear, no obvious abnormalities on inspection of external nose and ears. NECK: Normal movements of the head and neck. CARDIOPULMONARY: No increased WOB. Speaking in clear sentences. I:E ratio WNL.  MS: Moves all visible extremities without noticeable abnormality. PSYCH: Pleasant and cooperative, well-groomed. Speech normal rate and rhythm. Affect is appropriate. Insight and judgement are appropriate. Attention is focused, linear, and appropriate.  NEURO: CN grossly intact. Oriented as arrived to appointment on time with no prompting. Moves both UE equally.  SKIN:  Scattered area of erythema to bilateral forearms with blistery-appearance  Assessment and Plan:   Kayla Solis was seen today for poison ivy.  Diagnoses and all orders for this visit:  Plant irritant contact dermatitis  Other orders -     predniSONE (DELTASONE) 10 MG tablet; Take two tablets daily x 1 week, then one tablet daily x 1 week.   No red flags on discussion. Will treat with oral prednisone x 2 weeks. Recommend oral antihistamine for itching. Follow-up precautions advised.  . Reviewed expectations re: course of current medical issues. . Discussed self-management of symptoms. . Outlined signs and symptoms indicating need for more acute intervention. . Patient verbalized understanding and all questions were answered. Marland Kitchen Health Maintenance issues including appropriate healthy diet, exercise, and smoking avoidance were discussed with patient. . See orders for this visit as documented in the electronic medical record.  I discussed  the assessment and treatment plan with the patient. The patient was provided an opportunity to ask questions and all were answered. The patient agreed with the plan and demonstrated an understanding of the instructions.   The patient was advised to call back or seek an  in-person evaluation if the symptoms worsen or if the condition fails to improve as anticipated.   CMA or LPN served as scribe during this visit. History, Physical, and Plan performed by medical provider. The above documentation has been reviewed and is accurate and complete.   [place], [place] 08/16/2019

## 2019-09-06 ENCOUNTER — Ambulatory Visit (INDEPENDENT_AMBULATORY_CARE_PROVIDER_SITE_OTHER): Payer: 59 | Admitting: Psychology

## 2019-09-06 DIAGNOSIS — F411 Generalized anxiety disorder: Secondary | ICD-10-CM | POA: Diagnosis not present

## 2019-10-20 ENCOUNTER — Ambulatory Visit (INDEPENDENT_AMBULATORY_CARE_PROVIDER_SITE_OTHER): Payer: 59 | Admitting: Psychology

## 2019-10-20 DIAGNOSIS — F411 Generalized anxiety disorder: Secondary | ICD-10-CM | POA: Diagnosis not present

## 2019-11-17 ENCOUNTER — Encounter: Payer: Self-pay | Admitting: Physician Assistant

## 2019-11-18 ENCOUNTER — Other Ambulatory Visit: Payer: Self-pay | Admitting: Physician Assistant

## 2019-11-18 MED ORDER — PREDNISONE 10 MG PO TABS: ORAL_TABLET | ORAL | 0 refills | Status: DC

## 2020-03-13 ENCOUNTER — Other Ambulatory Visit: Payer: Self-pay | Admitting: Physician Assistant

## 2020-04-15 ENCOUNTER — Other Ambulatory Visit: Payer: Self-pay | Admitting: Physician Assistant

## 2020-04-27 ENCOUNTER — Other Ambulatory Visit: Payer: 59

## 2020-04-27 DIAGNOSIS — Z20822 Contact with and (suspected) exposure to covid-19: Secondary | ICD-10-CM

## 2020-04-29 LAB — SARS-COV-2, NAA 2 DAY TAT

## 2020-04-29 LAB — NOVEL CORONAVIRUS, NAA: SARS-CoV-2, NAA: NOT DETECTED

## 2020-05-15 ENCOUNTER — Other Ambulatory Visit: Payer: Self-pay | Admitting: Physician Assistant

## 2020-06-16 ENCOUNTER — Other Ambulatory Visit: Payer: Self-pay | Admitting: Physician Assistant

## 2020-07-21 ENCOUNTER — Other Ambulatory Visit: Payer: Self-pay | Admitting: Physician Assistant

## 2020-08-19 ENCOUNTER — Other Ambulatory Visit: Payer: Self-pay | Admitting: Physician Assistant

## 2020-08-22 ENCOUNTER — Encounter: Payer: 59 | Admitting: Physician Assistant

## 2020-08-23 ENCOUNTER — Encounter: Payer: Self-pay | Admitting: Family

## 2020-08-23 ENCOUNTER — Other Ambulatory Visit (HOSPITAL_COMMUNITY)
Admission: RE | Admit: 2020-08-23 | Discharge: 2020-08-23 | Disposition: A | Discharge status: Home / Self Care | Payer: 59 | Source: Ambulatory Visit | Attending: Family | Admitting: Family

## 2020-08-23 ENCOUNTER — Other Ambulatory Visit: Payer: Self-pay

## 2020-08-23 ENCOUNTER — Ambulatory Visit (INDEPENDENT_AMBULATORY_CARE_PROVIDER_SITE_OTHER): Payer: 59 | Admitting: Family

## 2020-08-23 VITALS — BP 120/96 | HR 75 | Temp 98.1°F | Ht 62.0 in | Wt 149.0 lb

## 2020-08-23 DIAGNOSIS — Z803 Family history of malignant neoplasm of breast: Secondary | ICD-10-CM | POA: Diagnosis not present

## 2020-08-23 DIAGNOSIS — Z23 Encounter for immunization: Secondary | ICD-10-CM | POA: Diagnosis not present

## 2020-08-23 DIAGNOSIS — Z800 Family history of malignant neoplasm of digestive organs: Secondary | ICD-10-CM | POA: Diagnosis not present

## 2020-08-23 DIAGNOSIS — Z1231 Encounter for screening mammogram for malignant neoplasm of breast: Secondary | ICD-10-CM

## 2020-08-23 DIAGNOSIS — Z124 Encounter for screening for malignant neoplasm of cervix: Secondary | ICD-10-CM | POA: Diagnosis present

## 2020-08-23 DIAGNOSIS — Z0000 Encounter for general adult medical examination without abnormal findings: Secondary | ICD-10-CM | POA: Diagnosis not present

## 2020-08-23 NOTE — Patient Instructions (Signed)
Health Maintenance, Female Adopting a healthy lifestyle and getting preventive care are important in promoting health and wellness. Ask your health care provider about:  The right schedule for you to have regular tests and exams.  Things you can do on your own to prevent diseases and keep yourself healthy. What should I know about diet, weight, and exercise? Eat a healthy diet  Eat a diet that includes plenty of vegetables, fruits, low-fat dairy products, and lean protein.  Do not eat a lot of foods that are high in solid fats, added sugars, or sodium.   Maintain a healthy weight Body mass index (BMI) is used to identify weight problems. It estimates body fat based on height and weight. Your health care provider can help determine your BMI and help you achieve or maintain a healthy weight. Get regular exercise Get regular exercise. This is one of the most important things you can do for your health. Most adults should:  Exercise for at least 150 minutes each week. The exercise should increase your heart rate and make you sweat (moderate-intensity exercise).  Do strengthening exercises at least twice a week. This is in addition to the moderate-intensity exercise.  Spend less time sitting. Even light physical activity can be beneficial. Watch cholesterol and blood lipids Have your blood tested for lipids and cholesterol at 41 years of age, then have this test every 5 years. Have your cholesterol levels checked more often if:  Your lipid or cholesterol levels are high.  You are older than 41 years of age.  You are at high risk for heart disease. What should I know about cancer screening? Depending on your health history and family history, you may need to have cancer screening at various ages. This may include screening for:  Breast cancer.  Cervical cancer.  Colorectal cancer.  Skin cancer.  Lung cancer. What should I know about heart disease, diabetes, and high blood  pressure? Blood pressure and heart disease  High blood pressure causes heart disease and increases the risk of stroke. This is more likely to develop in people who have high blood pressure readings, are of African descent, or are overweight.  Have your blood pressure checked: ? Every 3-5 years if you are 38-35 years of age. ? Every year if you are 19 years old or older. Diabetes Have regular diabetes screenings. This checks your fasting blood sugar level. Have the screening done:  Once every three years after age 64 if you are at a normal weight and have a low risk for diabetes.  More often and at a younger age if you are overweight or have a high risk for diabetes. What should I know about preventing infection? Hepatitis B If you have a higher risk for hepatitis B, you should be screened for this virus. Talk with your health care provider to find out if you are at risk for hepatitis B infection. Hepatitis C Testing is recommended for:  Everyone born from [birth year] through 1966.  Anyone with known risk factors for hepatitis C. Sexually transmitted infections (STIs)  Get screened for STIs, including gonorrhea and chlamydia, if: ? You are sexually active and are younger than 41 years of age. ? You are older than 41 years of age and your health care provider tells you that you are at risk for this type of infection. ? Your sexual activity has changed since you were last screened, and you are at increased risk for chlamydia or gonorrhea. Ask your health care  if you are at risk.  Ask your health care provider about whether you are at high risk for HIV. Your health care provider may recommend a prescription medicine to help prevent HIV infection. If you choose to take medicine to prevent HIV, you should first get tested for HIV. You should then be tested every 3 months for as long as you are taking the medicine. Pregnancy  If you are about to stop having your period (premenopausal) and  you may become pregnant, seek counseling before you get pregnant.  Take 400 to 800 micrograms (mcg) of folic acid every day if you become pregnant.  Ask for birth control (contraception) if you want to prevent pregnancy. Osteoporosis and menopause Osteoporosis is a disease in which the bones lose minerals and strength with aging. This can result in bone fractures. If you are 65 years old or older, or if you are at risk for osteoporosis and fractures, ask your health care provider if you should:  Be screened for bone loss.  Take a calcium or vitamin D supplement to lower your risk of fractures.  Be given hormone replacement therapy (HRT) to treat symptoms of menopause. Follow these instructions at home: Lifestyle  Do not use any products that contain nicotine or tobacco, such as cigarettes, e-cigarettes, and chewing tobacco. If you need help quitting, ask your health care provider.  Do not use street drugs.  Do not share needles.  Ask your health care provider for help if you need support or information about quitting drugs. Alcohol use  Do not drink alcohol if: ? Your health care provider tells you not to drink. ? You are pregnant, may be pregnant, or are planning to become pregnant.  If you drink alcohol: ? Limit how much you use to 0-1 drink a day. ? Limit intake if you are breastfeeding.  Be aware of how much alcohol is in your drink. In the [place] one drink equals one 12 oz bottle of beer (355 mL), one 5 oz glass of wine (148 mL), or one 1 oz glass of hard liquor (44 mL). General instructions  Schedule regular health, dental, and eye exams.  Stay current with your vaccines.  Tell your health care provider if: ? You often feel depressed. ? You have ever been abused or do not feel safe at home. Summary  Adopting a healthy lifestyle and getting preventive care are important in promoting health and wellness.  Follow your health care provider's instructions about healthy  diet, exercising, and getting tested or screened for diseases.  Follow your health care provider's instructions on monitoring your cholesterol and blood pressure. This information is not intended to replace advice given to you by your health care provider. Make sure you discuss any questions you have with your health care provider. Document Revised: 03/24/2018 Document Reviewed: 03/24/2018 Elsevier Patient Education  2021 [employer] Colonoscopy, Adult A colonoscopy is a procedure to look at the entire large intestine. This procedure is done using a long, thin, flexible tube that has a camera on the end. You may have a colonoscopy:  As a part of normal colorectal screening.  If you have certain symptoms, such as: ? A low number of red blood cells in your blood (anemia). ? Diarrhea that does not go away. ? Pain in your abdomen. ? Blood in your stool. A colonoscopy can help screen for and diagnose medical problems, including:  Tumors.  Extra tissue that grows where mucus forms (polyps).  Inflammation.  Areas of   bleeding. Tell your health care provider about:  Any allergies you have.  All medicines you are taking, including vitamins, herbs, eye drops, creams, and over-the-counter medicines.  Any problems you or family members have had with anesthetic medicines.  Any blood disorders you have.  Any surgeries you have had.  Any medical conditions you have.  Any problems you have had with having bowel movements.  Whether you are pregnant or may be pregnant. What are the risks? Generally, this is a safe procedure. However, problems may occur, including:  Bleeding.  Damage to your intestine.  Allergic reactions to medicines given during the procedure.  Infection. This is rare. What happens before the procedure? Eating and drinking restrictions Follow instructions from your health care provider about eating or drinking restrictions, which may include:  A few days  before the procedure: ? Follow a low-fiber diet. ? Avoid nuts, seeds, dried fruit, raw fruits, and vegetables.  1-3 days before the procedure: ? Eat only gelatin dessert or ice pops. ? Drink only clear liquids, such as water, clear juice, clear broth or bouillon, black coffee or tea, or clear soft drinks or sports drinks. ? Avoid liquids that contain red or purple dye.  The day of the procedure: ? Do not eat solid foods. You may continue to drink clear liquids until up to 2 hours before the procedure. ? Do not eat or drink anything starting 2 hours before the procedure, or within the time period that your health care provider recommends. Bowel prep If you were prescribed a bowel prep to take by mouth (orally) to clean out your colon:  Take it as told by your health care provider. Starting the day before your procedure, you will need to drink a large amount of liquid medicine. The liquid will cause you to have many bowel movements of loose stool until your stool becomes almost clear or light green.  If your skin or the opening between the buttocks (anus) gets irritated from diarrhea, you may relieve the irritation using: ? Wipes with medicine in them, such as adult wet wipes with aloe and vitamin E. ? A product to soothe skin, such as petroleum jelly.  If you vomit while drinking the bowel prep: ? Take a break for up to 60 minutes. ? Begin the bowel prep again. ? Call your health care provider if you keep vomiting or you cannot take the bowel prep without vomiting.  To clean out your colon, you may also be given: ? Laxative medicines. These help you have a bowel movement. ? Instructions for enema use. An enema is liquid medicine injected into your rectum. Medicines Ask your health care provider about:  Changing or stopping your regular medicines or supplements. This is especially important if you are taking iron supplements, diabetes medicines, or blood thinners.  Taking medicines  such as aspirin and ibuprofen. These medicines can thin your blood. Do not take these medicines unless your health care provider tells you to take them.  Taking over-the-counter medicines, vitamins, herbs, and supplements. General instructions  Ask your health care provider what steps will be taken to help prevent infection. These may include washing skin with a germ-killing soap.  Plan to have someone take you home from the hospital or clinic. What happens during the procedure?  An IV will be inserted into one of your veins.  You may be given one or more of the following: ? A medicine to help you relax (sedative). ? A medicine to numb the   area (local anesthetic). ? A medicine to make you fall asleep (general anesthetic). This is rarely needed.  You will lie on your side with your knees bent.  The tube will: ? Have oil or gel put on it (be lubricated). ? Be inserted into your anus. ? Be gently eased through all parts of your large intestine.  Air will be sent into your colon to keep it open. This may cause some pressure or cramping.  Images will be taken with the camera and will appear on a screen.  A small tissue sample may be removed to be looked at under a microscope (biopsy). The tissue may be sent to a lab for testing if any signs of problems are found.  If small polyps are found, they may be removed and checked for cancer cells.  When the procedure is finished, the tube will be removed. The procedure may vary among health care providers and hospitals.   What happens after the procedure?  Your blood pressure, heart rate, breathing rate, and blood oxygen level will be monitored until you leave the hospital or clinic.  You may have a small amount of blood in your stool.  You may pass gas and have mild cramping or bloating in your abdomen. This is caused by the air that was used to open your colon during the exam.  Do not drive for 24 hours after the procedure.  It is up  to you to get the results of your procedure. Ask your health care provider, or the department that is doing the procedure, when your results will be ready. Summary  A colonoscopy is a procedure to look at the entire large intestine.  Follow instructions from your health care provider about eating and drinking before the procedure.  If you were prescribed an oral bowel prep to clean out your colon, take it as told by your health care provider.  During the colonoscopy, a flexible tube with a camera on its end is inserted into the anus and then passed into the other parts of the large intestine. This information is not intended to replace advice given to you by your health care provider. Make sure you discuss any questions you have with your health care provider. Document Revised: 10/22/2018 Document Reviewed: 10/22/2018 Elsevier Patient Education  2021 Elsevier Inc.  

## 2020-08-24 ENCOUNTER — Other Ambulatory Visit: Payer: Self-pay | Admitting: Family

## 2020-08-24 DIAGNOSIS — D508 Other iron deficiency anemias: Secondary | ICD-10-CM

## 2020-08-24 DIAGNOSIS — Z800 Family history of malignant neoplasm of digestive organs: Secondary | ICD-10-CM

## 2020-08-24 LAB — LIPID PANEL
Cholesterol: 234 mg/dL — ABNORMAL HIGH (ref 0–200)
HDL: 79 mg/dL (ref >39.00)
LDL Cholesterol: 132 mg/dL — ABNORMAL HIGH (ref 0–99)
NonHDL: 155.2
Total CHOL/HDL Ratio: 3
Triglycerides: 116 mg/dL (ref 0.0–149.0)
VLDL: 23.2 mg/dL (ref 0.0–40.0)

## 2020-08-24 LAB — COMPREHENSIVE METABOLIC PANEL
ALT: 24 U/L (ref 0–35)
AST: 26 U/L (ref 0–37)
Albumin: 4.6 g/dL (ref 3.5–5.2)
Alkaline Phosphatase: 56 U/L (ref 39–117)
BUN: 12 mg/dL (ref 6–23)
CO2: 26 mEq/L (ref 19–32)
Calcium: 9.6 mg/dL (ref 8.4–10.5)
Chloride: 102 mEq/L (ref 96–112)
Creatinine, Ser: 0.73 mg/dL (ref 0.40–1.20)
GFR: 102.69 mL/min (ref >60.00)
Glucose, Bld: 85 mg/dL (ref 70–99)
Potassium: 4.2 mEq/L (ref 3.5–5.1)
Sodium: 138 mEq/L (ref 135–145)
Total Bilirubin: 0.4 mg/dL (ref 0.2–1.2)
Total Protein: 7.4 g/dL (ref 6.0–8.3)

## 2020-08-24 LAB — CBC WITH DIFFERENTIAL/PLATELET
Basophils Absolute: 0 10*3/uL (ref 0.0–0.1)
Basophils Relative: 0.2 % (ref 0.0–3.0)
Eosinophils Absolute: 0.4 10*3/uL (ref 0.0–0.7)
Eosinophils Relative: 5.6 % — ABNORMAL HIGH (ref 0.0–5.0)
HCT: 31.4 % — ABNORMAL LOW (ref 36.0–46.0)
Hemoglobin: 10 g/dL — ABNORMAL LOW (ref 12.0–15.0)
Lymphocytes Relative: 32.7 % (ref 12.0–46.0)
Lymphs Abs: 2.3 10*3/uL (ref 0.7–4.0)
MCHC: 31.7 g/dL (ref 30.0–36.0)
MCV: 69.9 fl — ABNORMAL LOW (ref 78.0–100.0)
Monocytes Absolute: 0.8 10*3/uL (ref 0.1–1.0)
Monocytes Relative: 11.3 % (ref 3.0–12.0)
Neutro Abs: 3.5 10*3/uL (ref 1.4–7.7)
Neutrophils Relative %: 50.2 % (ref 43.0–77.0)
Platelets: 327 10*3/uL (ref 150.0–400.0)
RBC: 4.49 Mil/uL (ref 3.87–5.11)
RDW: 16.2 % — ABNORMAL HIGH (ref 11.5–15.5)
WBC: 7 10*3/uL (ref 4.0–10.5)

## 2020-08-24 LAB — HIV ANTIBODY (ROUTINE TESTING W REFLEX): HIV 1&2 Ab, 4th Generation: NONREACTIVE

## 2020-08-24 LAB — HEPATITIS C ANTIBODY
Hepatitis C Ab: NONREACTIVE
SIGNAL TO CUT-OFF: 0.01 (ref <1.00)

## 2020-08-24 LAB — TSH: TSH: 1.03 u[IU]/mL (ref 0.35–4.50)

## 2020-08-24 NOTE — Progress Notes (Signed)
Established Patient Office Visit  Subjective:  Patient ID: Kayla Solis, female    DOB: 11/28/1980  Age: 40 y.o. MRN: [id number]  CC:  Chief Complaint  Patient presents with  . Annual Exam  . Fatigue    Associated with lightheadedness, dizziness, nausea.  . Hypotension    Last time she gave blood, was told her blood pressures were low   Kayla Solis is a 40 year old female presenting for a CPX with pap smear. She has a family history of breast cancer in her mother and colon cancer in her sister at age 67. She had her first colonoscopy 4 years ago and had a 4-5 mm polyp removed. She is due by at year 5 (2023). She denies and blood in her stools or dark black stools. She is due for a mammogram. Unsure of last tdap.   Past Medical History:  Diagnosis Date  . Allergic rhinitis   . Asthma   . Depression with anxiety   . Family history of breast cancer in mother 07/28/2016  . Family history of colon cancer   . Gestational diabetes mellitus 08/10/2015  . Iron deficiency anemia 07/28/2016    Past Surgical History:  Procedure Laterality Date  . DILATION AND CURETTAGE OF UTERUS N/A 03/01/2014   Procedure: DILATATION AND CURETTAGE;  Surgeon: Melina Schools, MD;  Location: [facility];  Service: Gynecology;  Laterality: N/A;  . TONSILLECTOMY      Family History  Problem Relation Age of Onset  . Breast cancer Mother 11       metastatic breast cancer  . Colon cancer Maternal Grandmother        dx in her 30s  . Heart attack Maternal Grandmother   . Colon cancer Sister 33  . Skin cancer Father   . Heart attack Maternal Grandfather   . Heart attack Maternal Aunt   . Pneumonia Maternal Uncle     Social History   Socioeconomic History  . Marital status: Married    Spouse name: Not on file  . Number of children: 2  . Years of education: Not on file  . Highest education level: Not on file  Occupational History  . Occupation: Copywriter, advertising: [employer]  Tobacco  Use  . Smoking status: Never Smoker  . Smokeless tobacco: Never Used  Substance and Sexual Activity  . Alcohol use: No  . Drug use: No  . Sexual activity: Yes    Birth control/protection: None  Other Topics Concern  . Not on file  Social History Narrative  . Not on file   Social Determinants of Health   Financial Resource Strain: Not on file  Food Insecurity: Not on file  Transportation Needs: Not on file  Physical Activity: Not on file  Stress: Not on file  Social Connections: Not on file  Intimate Partner Violence: Not on file    Outpatient Medications Prior to Visit  Medication Sig Dispense Refill  . buPROPion (WELLBUTRIN XL) 150 MG 24 hr tablet TAKE 150 MG TABLET DAILY. PLEASE SCHEDULE AN APPT WITH SAMANTHA WORLEY, PA FOR FURTHER REFILLS [phone] 30 tablet 0  . sertraline (ZOLOFT) 50 MG tablet TAKE 1 TABLET (50 MG TOTAL) BY MOUTH DAILY. PLEASE SCHEDULE AN APPT WITH SAMANTHA WORLEY, PA FOR FURTHER REFILLS [phone] 30 tablet 0  . predniSONE (DELTASONE) 10 MG tablet Take two tablets daily x 1 week, then one tablet daily x 1 week. (Patient not taking: Reported on 08/23/2020) 21  tablet 0   No facility-administered medications prior to visit.    No Known Allergies  ROS Review of Systems  Constitutional: Positive for fatigue.  HENT: Negative.   Eyes: Negative.   Respiratory: Negative.   Cardiovascular: Negative.   Gastrointestinal: Negative.   Endocrine: Negative.   Genitourinary: Negative.   Musculoskeletal: Negative.   Skin: Negative.   Allergic/Immunologic: Negative.   Neurological: Negative.   Hematological: Negative.   Psychiatric/Behavioral: Negative.   All other systems reviewed and are negative.     Objective:    Physical Exam Vitals reviewed.  Constitutional:      Appearance: Normal appearance. She is normal weight.  HENT:     Head: Normocephalic and atraumatic.     Right Ear: Tympanic membrane normal.     Left Ear: Tympanic membrane  normal.     Nose: Nose normal.     Mouth/Throat:     Mouth: Mucous membranes are moist.  Eyes:     Extraocular Movements: Extraocular movements intact.     Pupils: Pupils are equal, round, and reactive to light.  Cardiovascular:     Rate and Rhythm: Normal rate and regular rhythm.     Pulses: Normal pulses.     Heart sounds: Normal heart sounds.  Pulmonary:     Effort: Pulmonary effort is normal.     Breath sounds: Normal breath sounds.  Abdominal:     General: Abdomen is flat.     Palpations: Abdomen is soft.  Genitourinary:    General: Normal vulva.     Rectum: Normal. Guaiac result negative.  Musculoskeletal:        General: Normal range of motion.     Cervical back: Normal range of motion and neck supple.  Skin:    General: Skin is warm and dry.  Neurological:     General: No focal deficit present.     Mental Status: She is alert and oriented to person, place, and time.  Psychiatric:        Mood and Affect: Mood normal.        Behavior: Behavior normal.     BP (!) 120/96   Pulse 75   Temp 98.1 F (36.7 C) (Temporal)   Ht 5\' 2"  (1.575 m)   Wt 149 lb (67.6 kg)   LMP 08/16/2020 (Approximate)   SpO2 99%   BMI 27.25 kg/m  Wt Readings from Last 3 Encounters:  08/23/20 149 lb (67.6 kg)  07/29/19 140 lb (63.5 kg)  06/17/19 140 lb (63.5 kg)     Health Maintenance Due  Topic Date Due  . PAP SMEAR-Modifier  02/06/2020  . COVID-19 Vaccine (2 - Pfizer 3-dose series) 03/11/2020    There are no preventive care reminders to display for this patient.  Lab Results  Component Value Date   TSH 1.03 08/23/2020   Lab Results  Component Value Date   WBC 7.0 08/23/2020   HGB 10.0 (L) 08/23/2020   HCT 31.4 (L) 08/23/2020   MCV 69.9 Repeated and verified X2. (L) 08/23/2020   PLT 327.0 08/23/2020   Lab Results  Component Value Date   NA 138 08/23/2020   K 4.2 08/23/2020   CO2 26 08/23/2020   GLUCOSE 85 08/23/2020   BUN 12 08/23/2020   CREATININE 0.73 08/23/2020    BILITOT 0.4 08/23/2020   ALKPHOS 56 08/23/2020   AST 26 08/23/2020   ALT 24 08/23/2020   PROT 7.4 08/23/2020   ALBUMIN 4.6 08/23/2020   CALCIUM 9.6 08/23/2020  ANIONGAP 10 03/01/2014   GFR 102.69 08/23/2020   Lab Results  Component Value Date   CHOL 234 (H) 08/23/2020   Lab Results  Component Value Date   HDL 79.00 08/23/2020   Lab Results  Component Value Date   LDLCALC 132 (H) 08/23/2020   Lab Results  Component Value Date   TRIG 116.0 08/23/2020   Lab Results  Component Value Date   CHOLHDL 3 08/23/2020   Lab Results  Component Value Date   HGBA1C 5.2 07/28/2016      Assessment & Plan:   Problem List Items Addressed This Visit    Family history of colon cancer   Relevant Orders   POCT Occult Blood Stool    Other Visit Diagnoses    Encounter for screening mammogram for malignant neoplasm of breast    -  Primary   Relevant Orders   Mammogram Digital Screening   Family history of breast cancer       Screening for malignant neoplasm of cervix       Relevant Orders   Cytology - PAP ()   Routine general medical examination at a health care facility       Relevant Orders   Tdap vaccine greater than or equal to 7yo IM (Completed)   Comprehensive metabolic panel (Completed)   CBC with Differential (Completed)   Lipid panel (Completed)   TSH (Completed)   Hepatitis C antibody screen (Completed)   HIV antibody (Completed)   POCT Occult Blood Stool      Referred for mammogram. Patient will report for colonoscopy screening next year. Exercise 45 min, 4 days a week. Monthly self breast exams. Call the office with any questions or concerns. Follow-up pending labs and sooner as needed.   Follow-up: No follow-ups on file.    Kennyth Arnold, FNP

## 2020-08-27 LAB — CYTOLOGY - PAP
Adequacy: ABSENT
Diagnosis: NEGATIVE

## 2020-09-26 ENCOUNTER — Encounter: Payer: Self-pay | Admitting: Physician Assistant

## 2020-11-06 ENCOUNTER — Encounter: Payer: Self-pay | Admitting: Genetic Counselor

## 2020-11-07 ENCOUNTER — Other Ambulatory Visit: Payer: Self-pay

## 2020-11-07 ENCOUNTER — Ambulatory Visit
Admission: RE | Admit: 2020-11-07 | Discharge: 2020-11-07 | Disposition: A | Discharge status: Home / Self Care | Payer: 59 | Source: Ambulatory Visit | Attending: Family | Admitting: Family

## 2020-11-07 DIAGNOSIS — Z1231 Encounter for screening mammogram for malignant neoplasm of breast: Secondary | ICD-10-CM

## 2021-01-18 ENCOUNTER — Other Ambulatory Visit: Payer: Self-pay | Admitting: Physician Assistant

## 2021-10-26 ENCOUNTER — Other Ambulatory Visit: Payer: Self-pay | Admitting: Physician Assistant

## 2021-11-23 ENCOUNTER — Other Ambulatory Visit: Payer: Self-pay | Admitting: Physician Assistant

## 2022-01-06 ENCOUNTER — Encounter: Payer: Self-pay | Admitting: *Deleted

## 2022-02-19 ENCOUNTER — Encounter: Payer: Self-pay | Admitting: Gastroenterology

## 2022-05-05 ENCOUNTER — Ambulatory Visit (HOSPITAL_BASED_OUTPATIENT_CLINIC_OR_DEPARTMENT_OTHER)
Admission: RE | Admit: 2022-05-05 | Discharge: 2022-05-05 | Disposition: A | Discharge status: Home / Self Care | Payer: 59 | Source: Ambulatory Visit | Attending: Physician Assistant | Admitting: Physician Assistant

## 2022-05-05 ENCOUNTER — Other Ambulatory Visit (HOSPITAL_BASED_OUTPATIENT_CLINIC_OR_DEPARTMENT_OTHER): Payer: Self-pay | Admitting: Physician Assistant

## 2022-05-05 DIAGNOSIS — Z1231 Encounter for screening mammogram for malignant neoplasm of breast: Secondary | ICD-10-CM | POA: Diagnosis not present

## 2022-08-04 ENCOUNTER — Encounter: Payer: Self-pay | Admitting: Physician Assistant

## 2022-08-05 ENCOUNTER — Other Ambulatory Visit: Payer: Self-pay | Admitting: Physician Assistant

## 2022-08-05 MED ORDER — EPINEPHRINE 0.3 MG/0.3ML IJ SOAJ: 0.3000 mg | INTRAMUSCULAR | 2 refills | Status: AC | PRN

## 2022-10-11 ENCOUNTER — Other Ambulatory Visit: Payer: Self-pay | Admitting: Physician Assistant

## 2022-12-27 ENCOUNTER — Encounter: Payer: Self-pay | Admitting: Physician Assistant

## 2023-02-18 ENCOUNTER — Encounter: Payer: Self-pay | Admitting: Physician Assistant

## 2023-02-18 ENCOUNTER — Ambulatory Visit: Payer: 59 | Admitting: Physician Assistant

## 2023-02-18 VITALS — BP 124/80 | HR 90 | Temp 98.7°F | Ht 62.0 in | Wt 161.4 lb

## 2023-02-18 DIAGNOSIS — R059 Cough, unspecified: Secondary | ICD-10-CM

## 2023-02-18 DIAGNOSIS — F418 Other specified anxiety disorders: Secondary | ICD-10-CM | POA: Diagnosis not present

## 2023-02-18 LAB — POC INFLUENZA A&B (BINAX/QUICKVUE)
Influenza A, POC: NEGATIVE
Influenza B, POC: NEGATIVE

## 2023-02-18 LAB — POC COVID19 BINAXNOW: SARS Coronavirus 2 Ag: NEGATIVE

## 2023-02-18 MED ORDER — PREDNISONE 50 MG PO TABS: ORAL_TABLET | ORAL | 0 refills | Status: DC

## 2023-02-18 MED ORDER — SERTRALINE HCL 50 MG PO TABS: 50.0000 mg | ORAL_TABLET | Freq: Every day | ORAL | 1 refills | Status: DC

## 2023-02-18 MED ORDER — AZITHROMYCIN 250 MG PO TABS: ORAL_TABLET | ORAL | 0 refills | Status: AC

## 2023-02-18 MED ORDER — BUPROPION HCL ER (XL) 150 MG PO TB24: 150.0000 mg | ORAL_TABLET | Freq: Every day | ORAL | 1 refills | Status: DC

## 2023-02-18 NOTE — Progress Notes (Signed)
Kayla Solis is a 42 y.o. female here for a new problem.  History of Present Illness:   Chief Complaint  Patient presents with   Cough    Pt c/o cough and congestion, expectorating Cerone/yellow sputum since Sunday. Low grade fever of 100 today. Headache, fatigue.   Anxiety/Depression    Pt has been off her medication for awhile, last seen in 2022. Pt has been depressed and wants to get back on her medication.   Upper Respiratory Infection She has been experiencing cough with yellow sputum, head congestion, headache, fever, malaise/fatigue. Symptoms started with fever on 02/15/23. Denies shortness of breath, diarrhea, vomiting, sore throat. History of asthma- no flare-ups in recent years. Appetite decreased.  Anxiety and Depression  Previously treated with bupropion 150 mg daily, sertraline 50 mg daily. She has been without these for some time and is requesting refills. Denies suicidal ideation/hi   Past Medical History:  Diagnosis Date   Allergic rhinitis    Asthma    Depression with anxiety    Family history of breast cancer in mother 07/28/2016   Family history of colon cancer    Gestational diabetes mellitus 08/10/2015   Iron deficiency anemia 07/28/2016     Social History   Tobacco Use   Smoking status: Never   Smokeless tobacco: Never  Substance Use Topics   Alcohol use: No   Drug use: No    Past Surgical History:  Procedure Laterality Date   DILATION AND CURETTAGE OF UTERUS N/A 03/01/2014   Procedure: DILATATION AND CURETTAGE;  Surgeon: Bing Plume, MD;  Location: [facility];  Service: Gynecology;  Laterality: N/A;   TONSILLECTOMY      Family History  Problem Relation Age of Onset   Breast cancer Mother 53       metastatic breast cancer   Colon cancer Maternal Grandmother        dx in her 51s   Heart attack Maternal Grandmother    Colon cancer Sister 37   Skin cancer Father    Heart attack Maternal Grandfather    Heart attack Maternal Aunt     Pneumonia Maternal Uncle     No Known Allergies  Current Medications:   Current Outpatient Medications:    EPINEPHrine 0.3 mg/0.3 mL IJ SOAJ injection, Inject 0.3 mg into the muscle as needed for anaphylaxis., Disp: 1 each, Rfl: 2   Review of Systems:   Review of Systems  Constitutional:  Positive for fever and malaise/fatigue.  HENT:  Positive for congestion. Negative for sore throat.   Eyes:  Negative for blurred vision.  Respiratory:  Positive for cough and sputum production (Yellow). Negative for shortness of breath.   Cardiovascular:  Negative for chest pain, palpitations and leg swelling.  Gastrointestinal:  Negative for vomiting.  Musculoskeletal:  Negative for back pain.  Skin:  Negative for rash.  Neurological:  Positive for headaches. Negative for loss of consciousness.    Vitals:   Vitals:   02/18/23 1130  BP: 124/80  Pulse: 90  Temp: 98.7 F (37.1 C)  TempSrc: Temporal  SpO2: 96%  Weight: 161 lb 6.1 oz (73.2 kg)  Height: 5\' 2"  (1.575 m)     Body mass index is 29.52 kg/m.  Physical Exam:   Physical Exam Vitals and nursing note reviewed.  Constitutional:      General: She is not in acute distress.    Appearance: She is well-developed. She is not ill-appearing or toxic-appearing.  HENT:     Head:  Normocephalic and atraumatic.     Right Ear: Tympanic membrane, ear canal and external ear normal. Tympanic membrane is not erythematous, retracted or bulging.     Left Ear: Tympanic membrane, ear canal and external ear normal. Tympanic membrane is not erythematous, retracted or bulging.     Nose: Nose normal.     Right Sinus: No maxillary sinus tenderness or frontal sinus tenderness.     Left Sinus: No maxillary sinus tenderness or frontal sinus tenderness.     Mouth/Throat:     Pharynx: Uvula midline. No posterior oropharyngeal erythema.  Eyes:     General: Lids are normal.     Conjunctiva/sclera:     Right eye: Right conjunctiva is injected.     Left  eye: Left conjunctiva is injected.  Neck:     Trachea: Trachea normal.  Cardiovascular:     Rate and Rhythm: Normal rate and regular rhythm.     Heart sounds: Normal heart sounds, S1 normal and S2 normal.  Pulmonary:     Effort: Pulmonary effort is normal.     Breath sounds: Examination of the right-upper field reveals wheezing. Wheezing present. No decreased breath sounds, rhonchi or rales.  Lymphadenopathy:     Cervical: No cervical adenopathy.  Skin:    General: Skin is warm and dry.  Neurological:     Mental Status: She is alert.  Psychiatric:        Speech: Speech normal.        Behavior: Behavior normal. Behavior is cooperative.    Results for orders placed or performed in visit on 02/18/23  POC COVID-19  Result Value Ref Range   SARS Coronavirus 2 Ag Negative Negative  POC Influenza A&B(BINAX/QUICKVUE)  Result Value Ref Range   Influenza A, POC Negative Negative   Influenza B, POC Negative Negative    Assessment and Plan:   Cough, unspecified type No red flags on exam.  Will initiate azithromycin and prednisone per orders. Discussed taking medications as prescribed. Reviewed return precautions including worsening fever, SOB, worsening cough or other concerns. Push fluids and rest. I recommend that patient follow-up if symptoms worsen or persist despite treatment x 7-10 days, sooner if needed.   Depression with anxiety Uncontrolled Restart Wellbutrin 150 mg xl daily and Zoloft 50 mg daily Follow-up in 3-6 months for Comprehensive Physical Exam (CPE) preventive care annual visit, sooner if concerns I discussed with patient that if they develop any SI, to tell someone immediately and seek medical attention.    I,Alexander Ruley,acting as a Neurosurgeon for [employer], PA.,have documented all relevant documentation on the behalf of Jarold Motto, PA,as directed by  Jarold Motto, PA while in the presence of Jarold Motto, [place].   I, Jarold Motto, [place], have  reviewed all documentation for this visit. The documentation on 02/18/23 for the exam, diagnosis, procedures, and orders are all accurate and complete.    Jarold Motto, PA-C

## 2023-02-18 NOTE — Patient Instructions (Signed)
It was great to see you!  Start prednisone and azithromycin  Restart Wellbutrin and Zoloft   Let's follow-up in 3-6 months for Comprehensive Physical Exam (CPE) preventive care annual visit, sooner if you have concerns.  Take care,  Jarold Motto PA-C

## 2023-05-20 NOTE — Progress Notes (Signed)
 Subjective:    Kayla Solis is a 43 y.o. female and is here for a comprehensive physical exam.  HPI  Health Maintenance Due  Topic Date Due   Pneumococcal Vaccine 21-61 Years old (1 of 2 - PCV) Never done   Acute Concerns: Knee pain: Complains of left  knee pain, achy at night. Endorses some difficulty sleeping due to pain.  Previously slept comfortably on her side, lately has not been able to do so due to the pain.  Denies any known injuries.   Chronic Issues: Weight management: Pt is interested in weight loss  Walking 2-3 miles per day.  Overall eating healthy diet,  Eats foods like greek yogurt with mixed berries, and oatmeal for breakfast.  Tends to eat salads for lunch. Has pasta for dinner often.  Not supplementing with protein shakes, but did previously.  Hx of gestational diabetes, was not treated with any meds (including Metformin)  Health Maintenance: Immunizations -- UTD Colonoscopy -- Overdue. Last done 02/27/17. Results showed polyps found and removed. Otherwise was normal. Repeat of 5 years was recommended. Sister with colon cancer. Mammogram -- Last done 05/05/22. Results were normal. Pt did genetic testing after last colonoscopy.  PAP -- UTD, next due 08/2023. Last done 08/23/2020. Results were normal.  Bone Density -- N/a Diet -- Vegetarian. States she is not getting enough protein in.  Exercise -- walking the dog in the morning and evening for a combined 3 miles.   Sleep habits -- Some difficulty sleeping due to knee pain, otherwise no concerns.  Mood -- Stable  UTD with dentist? - Yes UTD with eye doctor? - No    Weight history: Wt Readings from Last 10 Encounters:  05/21/23 165 lb 4 oz (75 kg)  02/18/23 161 lb 6.1 oz (73.2 kg)  08/23/20 149 lb (67.6 kg)  07/29/19 140 lb (63.5 kg)  06/17/19 140 lb (63.5 kg)  03/04/19 140 lb (63.5 kg)  02/14/19 140 lb (63.5 kg)  07/15/17 139 lb 6.4 oz (63.2 kg)  02/27/17 141 lb (64 kg)  02/17/17 141 lb 12.8  oz (64.3 kg)   Body mass index is 30.22 kg/m. Patient's last menstrual period was 05/20/2023 (exact date).  Alcohol use:  reports no history of alcohol use.  Tobacco use:  Tobacco Use: Low Risk  (05/21/2023)   Patient History    Smoking Tobacco Use: Never    Smokeless Tobacco Use: Never    Passive Exposure: Not on file   Eligible for lung cancer screening? no     02/18/2023   11:47 AM  Depression screen PHQ 2/9  Decreased Interest 1  Down, Depressed, Hopeless 2  PHQ - 2 Score 3  Altered sleeping 0  Tired, decreased energy 2  Change in appetite 0  Feeling bad or failure about yourself  2  Trouble concentrating 1  Moving slowly or fidgety/restless 0  Suicidal thoughts 0  PHQ-9 Score 8  Difficult doing work/chores Somewhat difficult     Other providers/specialists: Patient Care Team: Job Lukes, [place] as PCP - General (Physician Assistant)    PMHx, SurgHx, SocialHx, Medications, and Allergies were reviewed in the Visit Navigator and updated as appropriate.   Past Medical History:  Diagnosis Date   Allergic rhinitis    Allergy    Asthma    Depression with anxiety    Family history of breast cancer in mother 07/28/2016   Family history of colon cancer    Gestational diabetes mellitus 08/10/2015   Iron  deficiency anemia 07/28/2016     Past Surgical History:  Procedure Laterality Date   DILATION AND CURETTAGE OF UTERUS N/A 03/01/2014   Procedure: DILATATION AND CURETTAGE;  Surgeon: Debby JULIANNA Lares, MD;  Location: [facility];  Service: Gynecology;  Laterality: N/A;   TONSILLECTOMY       Family History  Problem Relation Age of Onset   Breast cancer Mother 31       metastatic breast cancer   Colon cancer Maternal Grandmother        dx in her 42s   Heart attack Maternal Grandmother    Colon cancer Sister 66   Skin cancer Father    Heart attack Maternal Grandfather    Heart attack Maternal Aunt    Pneumonia Maternal Uncle     Social History   Tobacco  Use   Smoking status: Never   Smokeless tobacco: Never  Substance Use Topics   Alcohol use: No   Drug use: No    Review of Systems:   Review of Systems  Constitutional:  Negative for chills, fever, malaise/fatigue and weight loss.  HENT:  Negative for hearing loss, sinus pain and sore throat.   Respiratory:  Negative for cough and hemoptysis.   Cardiovascular:  Negative for chest pain, palpitations, leg swelling and PND.  Gastrointestinal:  Negative for abdominal pain, constipation, diarrhea, heartburn, nausea and vomiting.  Genitourinary:  Negative for dysuria, frequency and urgency.  Musculoskeletal:  Negative for back pain, myalgias and neck pain.  Skin:  Negative for itching and rash.  Neurological:  Negative for dizziness, tingling, seizures and headaches.  Endo/Heme/Allergies:  Negative for polydipsia.  Psychiatric/Behavioral:  Negative for depression. The patient is not nervous/anxious.      Objective:   BP 110/80 (BP Location: Left Arm, Patient Position: Sitting, Cuff Size: Normal)   Pulse 63   Temp 97.7 F (36.5 C) (Temporal)   Ht 5' 2 (1.575 m)   Wt 165 lb 4 oz (75 kg)   LMP 05/20/2023 (Exact Date)   SpO2 97%   BMI 30.22 kg/m  Body mass index is 30.22 kg/m.   General Appearance:    Alert, cooperative, no distress, appears stated age  Head:    Normocephalic, without obvious abnormality, atraumatic  Eyes:    PERRL, conjunctiva/corneas clear, EOM's intact, fundi    benign, both eyes  Ears:    Normal TM's and external ear canals, both ears  Nose:   Nares normal, septum midline, mucosa normal, no drainage    or sinus tenderness  Throat:   Lips, mucosa, and tongue normal; teeth and gums normal  Neck:   Supple, symmetrical, trachea midline, no adenopathy;    thyroid:  no enlargement/tenderness/nodules; no carotid   bruit or JVD  Back:     Symmetric, no curvature, ROM normal, no CVA tenderness  Lungs:     Clear to auscultation bilaterally, respirations unlabored   Chest Wall:    No tenderness or deformity   Heart:    Regular rate and rhythm, S1 and S2 normal, no murmur, rub or gallop  Breast Exam:    Deferred  Abdomen:     Soft, non-tender, bowel sounds active all four quadrants,    no masses, no organomegaly  Genitalia:    Deferred  Extremities:   Extremities normal, atraumatic, no cyanosis or edema  Pulses:   2+ and symmetric all extremities  Skin:   Skin color, texture, turgor normal, no rashes or lesions  Lymph nodes:   Cervical, supraclavicular,  and axillary nodes normal  Neurologic:   CNII-XII intact, normal strength, sensation and reflexes    throughout    Assessment/Plan:   Routine physical examination Today patient counseled on age appropriate routine health concerns for screening and prevention, each reviewed and up to date or declined. Immunizations reviewed and up to date or declined. Labs ordered and reviewed. Risk factors for depression reviewed and negative. Hearing function and visual acuity are intact. ADLs screened and addressed as needed. Functional ability and level of safety reviewed and appropriate. Education, counseling and referrals performed based on assessed risks today. Patient provided with a copy of personalized plan for preventive services.  Depression with anxiety Well controlled Continue Wellbutrin  150 mg xl daily and Zoloft  50 mg daily Follow-up 1 year -- sooner if concerns  Hyperlipidemia, unspecified hyperlipidemia type Update lipid panel Does have family history of MI -- discussed calcium score and provided handout if interested  History of gestational diabetes Update A1c Briefly discussed metformin as option to add if indicated Will provide further recommendations based on results  Obesity, unspecified class, unspecified obesity type, unspecified whether serious comorbidity present Continue efforts at healthy lifestyle Briefly discussed metformin Healthy vegetarian eating handouts provided  Other  iron deficiency anemia Update iron panel Recommend B12 supplement due to vegetarian status Discussed need for colonoscopy  Vegetarian Recommend B12 supplement due to vegetarian status Provided handout of protein options  Chronic pain of left knee Referral to sports medicine  Special screening for malignant neoplasms, colon; Family history of colon cancer Referral placed  Breast cancer screening by mammogram Referral placed  Skin cancer screening Referral placed   I, Vernell Forest, acting as a neurosurgeon for Lucie Buttner, [place]., have documented all relevant documentation on the behalf of Lucie Buttner, [place], as directed by  Lucie Buttner, PA while in the presence of Lucie Buttner, [place].  I, Vernell Forest, have reviewed all documentation for this visit. The documentation on 05/21/23 for the exam, diagnosis, procedures, and orders are all accurate and complete.  Lucie Buttner, PA-C [facility]

## 2023-05-21 ENCOUNTER — Encounter: Payer: Self-pay | Admitting: Physician Assistant

## 2023-05-21 ENCOUNTER — Ambulatory Visit (INDEPENDENT_AMBULATORY_CARE_PROVIDER_SITE_OTHER): Payer: 59 | Admitting: Physician Assistant

## 2023-05-21 VITALS — BP 110/80 | HR 63 | Temp 97.7°F | Ht 62.0 in | Wt 165.2 lb

## 2023-05-21 DIAGNOSIS — Z0000 Encounter for general adult medical examination without abnormal findings: Secondary | ICD-10-CM

## 2023-05-21 DIAGNOSIS — Z1283 Encounter for screening for malignant neoplasm of skin: Secondary | ICD-10-CM

## 2023-05-21 DIAGNOSIS — Z1231 Encounter for screening mammogram for malignant neoplasm of breast: Secondary | ICD-10-CM

## 2023-05-21 DIAGNOSIS — Z789 Other specified health status: Secondary | ICD-10-CM

## 2023-05-21 DIAGNOSIS — E669 Obesity, unspecified: Secondary | ICD-10-CM

## 2023-05-21 DIAGNOSIS — D508 Other iron deficiency anemias: Secondary | ICD-10-CM

## 2023-05-21 DIAGNOSIS — Z1211 Encounter for screening for malignant neoplasm of colon: Secondary | ICD-10-CM

## 2023-05-21 DIAGNOSIS — E785 Hyperlipidemia, unspecified: Secondary | ICD-10-CM

## 2023-05-21 DIAGNOSIS — G8929 Other chronic pain: Secondary | ICD-10-CM

## 2023-05-21 DIAGNOSIS — F418 Other specified anxiety disorders: Secondary | ICD-10-CM

## 2023-05-21 DIAGNOSIS — Z800 Family history of malignant neoplasm of digestive organs: Secondary | ICD-10-CM

## 2023-05-21 DIAGNOSIS — Z8632 Personal history of gestational diabetes: Secondary | ICD-10-CM

## 2023-05-21 DIAGNOSIS — M25562 Pain in left knee: Secondary | ICD-10-CM

## 2023-05-21 LAB — CBC WITH DIFFERENTIAL/PLATELET
Basophils Absolute: 0.1 10*3/uL (ref 0.0–0.1)
Basophils Relative: 2 % (ref 0.0–3.0)
Eosinophils Absolute: 0.2 10*3/uL (ref 0.0–0.7)
Eosinophils Relative: 3.5 % (ref 0.0–5.0)
HCT: 39.4 % (ref 36.0–46.0)
Hemoglobin: 12.7 g/dL (ref 12.0–15.0)
Lymphocytes Relative: 28.4 % (ref 12.0–46.0)
Lymphs Abs: 1.6 10*3/uL (ref 0.7–4.0)
MCHC: 32.2 g/dL (ref 30.0–36.0)
MCV: 84.2 fL (ref 78.0–100.0)
Monocytes Absolute: 0.6 10*3/uL (ref 0.1–1.0)
Monocytes Relative: 10.8 % (ref 3.0–12.0)
Neutro Abs: 3 10*3/uL (ref 1.4–7.7)
Neutrophils Relative %: 55.3 % (ref 43.0–77.0)
Platelets: 264 10*3/uL (ref 150.0–400.0)
RBC: 4.68 Mil/uL (ref 3.87–5.11)
RDW: 14.9 % (ref 11.5–15.5)
WBC: 5.5 10*3/uL (ref 4.0–10.5)

## 2023-05-21 LAB — COMPREHENSIVE METABOLIC PANEL
ALT: 19 U/L (ref 0–35)
AST: 22 U/L (ref 0–37)
Albumin: 4.4 g/dL (ref 3.5–5.2)
Alkaline Phosphatase: 73 U/L (ref 39–117)
BUN: 9 mg/dL (ref 6–23)
CO2: 26 meq/L (ref 19–32)
Calcium: 9.5 mg/dL (ref 8.4–10.5)
Chloride: 103 meq/L (ref 96–112)
Creatinine, Ser: 0.73 mg/dL (ref 0.40–1.20)
GFR: 100.74 mL/min (ref >60.00)
Glucose, Bld: 84 mg/dL (ref 70–99)
Potassium: 4.2 meq/L (ref 3.5–5.1)
Sodium: 138 meq/L (ref 135–145)
Total Bilirubin: 0.7 mg/dL (ref 0.2–1.2)
Total Protein: 7.1 g/dL (ref 6.0–8.3)

## 2023-05-21 LAB — IBC + FERRITIN
Ferritin: 10 ng/mL (ref 10.0–291.0)
Iron: 156 ug/dL — ABNORMAL HIGH (ref 42–145)
Saturation Ratios: 34 % (ref 20.0–50.0)
TIBC: 459.2 ug/dL — ABNORMAL HIGH (ref 250.0–450.0)
Transferrin: 328 mg/dL (ref 212.0–360.0)

## 2023-05-21 LAB — LIPID PANEL
Cholesterol: 250 mg/dL — ABNORMAL HIGH (ref 0–200)
HDL: 84.2 mg/dL (ref >39.00)
LDL Cholesterol: 146 mg/dL — ABNORMAL HIGH (ref 0–99)
NonHDL: 165.46
Total CHOL/HDL Ratio: 3
Triglycerides: 98 mg/dL (ref 0.0–149.0)
VLDL: 19.6 mg/dL (ref 0.0–40.0)

## 2023-05-21 LAB — HEMOGLOBIN A1C: Hgb A1c MFr Bld: 5 % (ref 4.6–6.5)

## 2023-05-21 LAB — VITAMIN B12: Vitamin B-12: 155 pg/mL — ABNORMAL LOW (ref 211–911)

## 2023-05-21 NOTE — Patient Instructions (Addendum)
 It was great to see you!  Referrals for: Sports Medicine, Dermatology, Mammogram, Gastroenterology   Consider calcium score (see handout)  Please go to the lab for blood work.   Our office will call you with your results unless you have chosen to receive results via MyChart.  If your blood work is normal we will follow-up each year for physicals and as scheduled for chronic medical problems.  If anything is abnormal we will treat accordingly and get you in for a follow-up.  Take care,  Esaias Cleavenger

## 2023-05-22 ENCOUNTER — Encounter: Payer: Self-pay | Admitting: Physician Assistant

## 2023-05-22 DIAGNOSIS — E7800 Pure hypercholesterolemia, unspecified: Secondary | ICD-10-CM

## 2023-05-22 DIAGNOSIS — E785 Hyperlipidemia, unspecified: Secondary | ICD-10-CM

## 2023-05-25 NOTE — Progress Notes (Signed)
   Rubin Payor, PhD, LAT, ATC acting as a scribe for Clementeen Graham, MD.  Kayla Solis is a 43 y.o. female who presents to [facility] at [facility] today for L knee pain x 3 months, worsening over the last 3-wks. No injury. L knee feels "achy" at night. Pt locates pain to the anterior medial aspect of her L knee.  L Knee swelling: no Mechanical symptoms: no Aggravates: stairs, sleeping w/ knee in deep flexion Treatments tried: no  Pertinent review of systems: No fevers or chills  Relevant historical information: Asthma iron deficiency anemia.   Exam:  BP 132/82   Pulse 65   Ht 5\' 2"  (1.575 m)   Wt 165 lb (74.8 kg)   LMP 05/20/2023 (Exact Date)   SpO2 98%   BMI 30.18 kg/m  General: Well Developed, well nourished, and in no acute distress.   MSK: Left knee normal-appearing Normal motion. Tender palpation medial joint line mildly. Stable ligamentous exam. Intact strength. Negative McMurray's test.    Lab and Radiology Results  Diagnostic Limited MSK Ultrasound of: Left knee Quadricep tendon intact and normal-appearing Patellar tendon intact and normal-appearing. Medial joint line no obvious degeneration normal-appearing meniscus. Lateral joint line normal appearing. Posterior knee no Baker's cyst. Impression: Normal MSK ultrasound examination of the knee   X-ray images left knee obtained today personally and independently interpreted. Minimal degenerative changes.  No acute fractures. Await formal radiology review   Assessment and Plan: 43 y.o. female with chronic left knee pain ongoing for about 3 months without injury.  Pain is located around the medial joint line.  Pain due to medial degeneration not well-seen on x-ray or ultrasound. Differential does include patellofemoral pain syndrome as well.  Plan for Voltaren gel Tylenol and oral NSAIDs.  We talked about muscle relaxers and we will hold off on those for now.  We also talked about a  cortisone injection we will hold off on that for now additionally.  Will proceed to physical therapy.  Check back in 8 weeks.  If not better consider steroid injection and/or MRI.   PDMP not reviewed this encounter. Orders Placed This Encounter  Procedures   [place] LIMITED JOINT SPACE STRUCTURES LOW LEFT(NO LINKED CHARGES)    Reason for Exam (SYMPTOM  OR DIAGNOSIS REQUIRED):   left knee pain    Preferred imaging location?:   [facility] An Knee AP/LAT W/[place] Left    Standing Status:   Future    Expiration Date:   06/23/2023    Reason for Exam (SYMPTOM  OR DIAGNOSIS REQUIRED):   left knee pain    Preferred imaging location?:   [place]    Is patient pregnant?:   No   Ambulatory referral to Physical Therapy    Referral Priority:   Routine    Referral Type:   Physical Medicine    Referral Reason:   Specialty Services Required    Requested Specialty:   Physical Therapy    Number of Visits Requested:   1   No orders of the defined types were placed in this encounter.    Discussed warning signs or symptoms. Please see discharge instructions. Patient expresses understanding.   The above documentation has been reviewed and is accurate and complete Clementeen Graham, M.D.

## 2023-05-26 ENCOUNTER — Ambulatory Visit: Payer: 59 | Admitting: Family Medicine

## 2023-05-26 ENCOUNTER — Other Ambulatory Visit: Payer: Self-pay

## 2023-05-26 ENCOUNTER — Ambulatory Visit (INDEPENDENT_AMBULATORY_CARE_PROVIDER_SITE_OTHER): Payer: 59

## 2023-05-26 VITALS — BP 132/82 | HR 65 | Ht 62.0 in | Wt 165.0 lb

## 2023-05-26 DIAGNOSIS — G8929 Other chronic pain: Secondary | ICD-10-CM

## 2023-05-26 DIAGNOSIS — M25562 Pain in left knee: Secondary | ICD-10-CM

## 2023-05-26 NOTE — Patient Instructions (Addendum)
Thank you for coming in today.   Please use Voltaren gel (Generic Diclofenac Gel) up to 4x daily for pain as needed.  This is available over-the-counter as both the name brand Voltaren gel and the generic diclofenac gel.   Please get an Xray today before you leave   I've referred you to Physical Therapy.  Let us know if you don't hear from them in one week.  Check back in 8 weeks

## 2023-05-27 ENCOUNTER — Encounter: Payer: Self-pay | Admitting: Family Medicine

## 2023-05-27 NOTE — Progress Notes (Signed)
Left knee x-ray looks normal to radiology

## 2023-06-04 ENCOUNTER — Ambulatory Visit (HOSPITAL_COMMUNITY)
Admission: RE | Admit: 2023-06-04 | Discharge: 2023-06-04 | Disposition: A | Discharge status: Home / Self Care | Payer: 59 | Source: Ambulatory Visit | Attending: Physician Assistant | Admitting: Physician Assistant

## 2023-06-04 DIAGNOSIS — E7800 Pure hypercholesterolemia, unspecified: Secondary | ICD-10-CM | POA: Insufficient documentation

## 2023-06-04 DIAGNOSIS — E785 Hyperlipidemia, unspecified: Secondary | ICD-10-CM | POA: Insufficient documentation

## 2023-06-05 ENCOUNTER — Encounter: Payer: Self-pay | Admitting: Physician Assistant

## 2023-06-07 ENCOUNTER — Encounter: Payer: Self-pay | Admitting: Physician Assistant

## 2023-06-08 MED ORDER — SERTRALINE HCL 50 MG PO TABS: 50.0000 mg | ORAL_TABLET | Freq: Every day | ORAL | 1 refills | Status: DC

## 2023-06-08 MED ORDER — BUPROPION HCL ER (XL) 150 MG PO TB24: 150.0000 mg | ORAL_TABLET | Freq: Every day | ORAL | 1 refills | Status: DC

## 2023-06-22 ENCOUNTER — Encounter: Payer: Self-pay | Admitting: Dermatology

## 2023-06-22 ENCOUNTER — Encounter (HOSPITAL_BASED_OUTPATIENT_CLINIC_OR_DEPARTMENT_OTHER): Payer: Self-pay | Admitting: Radiology

## 2023-06-22 ENCOUNTER — Ambulatory Visit (HOSPITAL_BASED_OUTPATIENT_CLINIC_OR_DEPARTMENT_OTHER)
Admission: RE | Admit: 2023-06-22 | Discharge: 2023-06-22 | Disposition: A | Discharge status: Home / Self Care | Payer: 59 | Source: Ambulatory Visit | Attending: Physician Assistant | Admitting: Physician Assistant

## 2023-06-22 ENCOUNTER — Ambulatory Visit: Payer: 59 | Admitting: Dermatology

## 2023-06-22 DIAGNOSIS — L814 Other melanin hyperpigmentation: Secondary | ICD-10-CM | POA: Diagnosis not present

## 2023-06-22 DIAGNOSIS — D225 Melanocytic nevi of trunk: Secondary | ICD-10-CM | POA: Diagnosis not present

## 2023-06-22 DIAGNOSIS — L821 Other seborrheic keratosis: Secondary | ICD-10-CM | POA: Diagnosis not present

## 2023-06-22 DIAGNOSIS — D492 Neoplasm of unspecified behavior of bone, soft tissue, and skin: Secondary | ICD-10-CM

## 2023-06-22 DIAGNOSIS — D1801 Hemangioma of skin and subcutaneous tissue: Secondary | ICD-10-CM

## 2023-06-22 DIAGNOSIS — Z1283 Encounter for screening for malignant neoplasm of skin: Secondary | ICD-10-CM

## 2023-06-22 DIAGNOSIS — W908XXA Exposure to other nonionizing radiation, initial encounter: Secondary | ICD-10-CM

## 2023-06-22 DIAGNOSIS — L578 Other skin changes due to chronic exposure to nonionizing radiation: Secondary | ICD-10-CM

## 2023-06-22 DIAGNOSIS — Z1231 Encounter for screening mammogram for malignant neoplasm of breast: Secondary | ICD-10-CM

## 2023-06-22 DIAGNOSIS — Z808 Family history of malignant neoplasm of other organs or systems: Secondary | ICD-10-CM

## 2023-06-22 DIAGNOSIS — D485 Neoplasm of uncertain behavior of skin: Secondary | ICD-10-CM

## 2023-06-22 NOTE — Progress Notes (Signed)
   New Patient Visit   Subjective  Kayla Solis is a 43 y.o. female who presents for the following:  Total Body Skin Exam (TBSE)  Patient present today for new patient visit for TBSE.The patient reports she has spots, moles and lesions to be evaluated, some may be new or changing. Patient has previously been treated by a dermatologist.Patient reports she has hx of bx. Patient reports family history of skin cancers.(Paternal grandmother - unsure of type) Patient reports throughout her lifetime has had moderate sun exposure. Currently, patient reports if she has excessive sun exposure, she does apply sunscreen and/or wears protective coverings.  The following portions of the chart were reviewed this encounter and updated as appropriate: medications, allergies, medical history  Review of Systems:  No other skin or systemic complaints except as noted in HPI or Assessment and Plan.  Objective  Well appearing patient in no apparent distress; mood and affect are within normal limits.  A full examination was performed including scalp, head, eyes, ears, nose, lips, neck, chest, axillae, abdomen, back, buttocks, bilateral upper extremities, bilateral lower extremities, hands, feet, fingers, toes, fingernails, and toenails. All findings within normal limits unless otherwise noted below.   Relevant exam findings are noted in the Assessment and Plan.   Mid Back 0.36mm pink  papule   Assessment & Plan   LENTIGINES, SEBORRHEIC KERATOSES, HEMANGIOMAS - Benign normal skin lesions - Benign-appearing - Call for any changes  BENIGN MELANOCYTIC NEVI - Tan-Godley and/or pink-flesh-colored symmetric macules and papules - Benign appearing on exam today - Observation - Call clinic for new or changing moles - Recommend daily use of broad spectrum spf 30+ sunscreen to sun-exposed areas.   MILD ACTINIC DAMAGE - Chronic condition, secondary to cumulative UV/sun exposure - diffuse scaly erythematous macules  with underlying dyspigmentation - Recommend daily broad spectrum sunscreen SPF 30+ to sun-exposed areas, reapply every 2 hours as needed.  - Staying in the shade or wearing long sleeves, sun glasses (UVA+UVB protection) and wide brim hats (4-inch brim around the entire circumference of the hat) are also recommended for sun protection.  - Call for new or changing lesions.  SKIN CANCER SCREENING PERFORMED TODAY NEOPLASM OF UNCERTAIN BEHAVIOR OF SKIN Mid Back Skin / nail biopsy Type of biopsy: tangential   Informed consent: discussed and consent obtained   Timeout: patient name, date of birth, surgical site, and procedure verified   Procedure prep:  Patient was prepped and draped in usual sterile fashion Prep type:  Isopropyl alcohol Anesthesia: the lesion was anesthetized in a standard fashion   Anesthetic:  1% lidocaine w/ epinephrine 1-100,000 buffered w/ 8.4% NaHCO3 Instrument used: DermaBlade   Hemostasis achieved with: pressure, aluminum chloride and electrodesiccation   Outcome: patient tolerated procedure well   Post-procedure details: sterile dressing applied and wound care instructions given   Dressing type: bandage and pressure dressing   Specimen 1 - Surgical pathology Differential Diagnosis: r/o irritated nevus vs other  Check Margins: yes  Return in about 1 year (around 06/21/2024) for TBSE.   Documentation: I have reviewed the above documentation for accuracy and completeness, and I agree with the above.  I, Shirron Marcha Solders, CMA, am acting as scribe for Milda Smart, MD.   Gwenith Daily, MD

## 2023-06-22 NOTE — Patient Instructions (Addendum)

## 2023-06-24 LAB — SURGICAL PATHOLOGY

## 2023-07-21 ENCOUNTER — Ambulatory Visit: Payer: 59 | Admitting: Family Medicine

## 2024-01-09 ENCOUNTER — Other Ambulatory Visit: Payer: Self-pay | Admitting: Medical Genetics

## 2024-01-13 ENCOUNTER — Encounter: Payer: Self-pay | Admitting: Gastroenterology

## 2024-01-13 ENCOUNTER — Ambulatory Visit (AMBULATORY_SURGERY_CENTER)

## 2024-01-13 VITALS — Ht 62.0 in | Wt 165.0 lb

## 2024-01-13 DIAGNOSIS — Z800 Family history of malignant neoplasm of digestive organs: Secondary | ICD-10-CM

## 2024-01-13 DIAGNOSIS — Z860100 Personal history of colon polyps, unspecified: Secondary | ICD-10-CM

## 2024-01-13 MED ORDER — NA SULFATE-K SULFATE-MG SULF 17.5-3.13-1.6 GM/177ML PO SOLN: 1.0000 | Freq: Once | ORAL | 0 refills | Status: AC

## 2024-01-13 NOTE — Progress Notes (Signed)

## 2024-01-28 ENCOUNTER — Ambulatory Visit (AMBULATORY_SURGERY_CENTER): Admitting: Gastroenterology

## 2024-01-28 ENCOUNTER — Encounter: Payer: Self-pay | Admitting: Gastroenterology

## 2024-01-28 VITALS — BP 102/61 | HR 62 | Temp 98.2°F | Resp 12 | Ht 62.0 in | Wt 165.0 lb

## 2024-01-28 DIAGNOSIS — Z860101 Personal history of adenomatous and serrated colon polyps: Secondary | ICD-10-CM | POA: Diagnosis not present

## 2024-01-28 DIAGNOSIS — D123 Benign neoplasm of transverse colon: Secondary | ICD-10-CM

## 2024-01-28 DIAGNOSIS — Z800 Family history of malignant neoplasm of digestive organs: Secondary | ICD-10-CM

## 2024-01-28 DIAGNOSIS — K648 Other hemorrhoids: Secondary | ICD-10-CM | POA: Diagnosis not present

## 2024-01-28 DIAGNOSIS — K6289 Other specified diseases of anus and rectum: Secondary | ICD-10-CM | POA: Diagnosis not present

## 2024-01-28 DIAGNOSIS — Z860100 Personal history of colon polyps, unspecified: Secondary | ICD-10-CM

## 2024-01-28 DIAGNOSIS — Z1211 Encounter for screening for malignant neoplasm of colon: Secondary | ICD-10-CM | POA: Diagnosis present

## 2024-01-28 MED ORDER — SODIUM CHLORIDE 0.9 % IV SOLN: 500.0000 mL | Freq: Once | INTRAVENOUS | Status: DC

## 2024-01-28 NOTE — Progress Notes (Signed)
 Pt's states no medical or surgical changes since previsit or office visit.

## 2024-01-28 NOTE — Patient Instructions (Signed)
 Resume previous diet. Continue present medications. Repeat colonoscopy in 5 years for surveillance.  Handouts provided on polyps and hemorrhoids.  YOU HAD AN ENDOSCOPIC PROCEDURE TODAY AT THE [facility]:   Refer to the procedure report that was given to you for any specific questions about what was found during the examination.  If the procedure report does not answer your questions, please call your gastroenterologist to clarify.  If you requested that your care partner not be given the details of your procedure findings, then the procedure report has been included in a sealed envelope for you to review at your convenience later.  YOU SHOULD EXPECT: Some feelings of bloating in the abdomen. Passage of more gas than usual.  Walking can help get rid of the air that was put into your GI tract during the procedure and reduce the bloating. If you had a lower endoscopy (such as a colonoscopy or flexible sigmoidoscopy) you may notice spotting of blood in your stool or on the toilet paper. If you underwent a bowel prep for your procedure, you may not have a normal bowel movement for a few days.  Please Note:  You might notice some irritation and congestion in your nose or some drainage.  This is from the oxygen used during your procedure.  There is no need for concern and it should clear up in a day or so.  SYMPTOMS TO REPORT IMMEDIATELY:  Following lower endoscopy (colonoscopy or flexible sigmoidoscopy):  Excessive amounts of blood in the stool  Significant tenderness or worsening of abdominal pains  Swelling of the abdomen that is new, acute  Fever of 100F or higher  For urgent or emergent issues, a gastroenterologist can be reached at any hour by calling [phone]. Do not use MyChart messaging for urgent concerns.    DIET:  We do recommend a small meal at first, but then you may proceed to your regular diet.  Drink plenty of fluids but you should avoid alcoholic beverages for  24 hours.  ACTIVITY:  You should plan to take it easy for the rest of today and you should NOT DRIVE or use heavy machinery until tomorrow (because of the sedation medicines used during the test).    FOLLOW UP: Our staff will call the number listed on your records the next business day following your procedure.  We will call around 7:15- 8:00 am to check on you and address any questions or concerns that you may have regarding the information given to you following your procedure. If we do not reach you, we will leave a message.     If any biopsies were taken you will be contacted by phone or by letter within the next 1-3 weeks.  Please call us  at [phone] if you have not heard about the biopsies in 3 weeks.    SIGNATURES/CONFIDENTIALITY: You and/or your care partner have signed paperwork which will be entered into your electronic medical record.  These signatures attest to the fact that that the information above on your After Visit Summary has been reviewed and is understood.  Full responsibility of the confidentiality of this discharge information lies with you and/or your care-partner.

## 2024-01-28 NOTE — Progress Notes (Signed)
 Called to room to assist during endoscopic procedure.  Patient ID and intended procedure confirmed with present staff. Received instructions for my participation in the procedure from the performing physician.

## 2024-01-28 NOTE — Progress Notes (Signed)
 [facility] History and Physical   Primary Care Physician:  Job Lukes, [place]   Reason for Procedure:   Family history of colon cancer, history of polyps  Plan:    colonoscopy     HPI: Kayla Solis is a 43 y.o. female  here for colonoscopy surveillance - 1 SSP removed 02/2017, sister had colon cancer dx age 55s.   Patient denies any bowel symptoms at this time. Otherwise feels well without any cardiopulmonary symptoms.   I have discussed risks / benefits of anesthesia and endoscopic procedure with Geni DELENA Daring and they wish to proceed with the exams as outlined today.    Past Medical History:  Diagnosis Date   Allergic rhinitis    Allergy    Asthma    Depression with anxiety    Family history of breast cancer in mother 07/28/2016   Family history of colon cancer    Gestational diabetes mellitus 08/10/2015   Iron deficiency anemia 07/28/2016    Past Surgical History:  Procedure Laterality Date   COLONOSCOPY  2018   DILATION AND CURETTAGE OF UTERUS N/A 03/01/2014   Procedure: DILATATION AND CURETTAGE;  Surgeon: Debby JULIANNA Lares, MD;  Location: [facility];  Service: Gynecology;  Laterality: N/A;   POLYPECTOMY     TONSILLECTOMY      Prior to Admission medications   Medication Sig Start Date End Date Taking? Authorizing Provider  EPINEPHrine  0.3 mg/0.3 mL IJ SOAJ injection Inject 0.3 mg into the muscle as needed for anaphylaxis. 08/05/22   Job Lukes, PA    Current Outpatient Medications  Medication Sig Dispense Refill   EPINEPHrine  0.3 mg/0.3 mL IJ SOAJ injection Inject 0.3 mg into the muscle as needed for anaphylaxis. 1 each 2   Current Facility-Administered Medications  Medication Dose Route Frequency Provider Last Rate Last Admin   0.9 %  sodium chloride  infusion  500 mL Intravenous Once Rayden Scheper, Elspeth SQUIBB, MD        Allergies as of 01/28/2024   (No Known Allergies)    Family History  Problem Relation Age of Onset   Breast cancer Mother  74       metastatic breast cancer   Skin cancer Father        basal or squamous   Colon polyps Sister    Colon cancer Sister 67   Heart attack Maternal Aunt    Pneumonia Maternal Uncle    Colon polyps Maternal Grandmother    Colon cancer Maternal Grandmother        dx in her 30s   Heart attack Maternal Grandmother    Heart attack Maternal Grandfather    Rectal cancer Neg Hx    Stomach cancer Neg Hx     Social History   Socioeconomic History   Marital status: Married    Spouse name: Not on file   Number of children: 2   Years of education: Not on file   Highest education level: Not on file  Occupational History   Occupation: Designer, television/film set: [employer]  Tobacco Use   Smoking status: Never   Smokeless tobacco: Never  Vaping Use   Vaping status: Never Used  Substance and Sexual Activity   Alcohol use: Yes    Comment: rare   Drug use: No   Sexual activity: Yes    Birth control/protection: None  Other Topics Concern   Not on file  Social History Narrative   [employer]ley [employer] -- full time from home  10 and 6    Social Drivers of Corporate investment banker Strain: Not on file  Food Insecurity: Not on file  Transportation Needs: Not on file  Physical Activity: Not on file  Stress: Not on file  Social Connections: Not on file  Intimate Partner Violence: Not on file    Review of Systems: All other review of systems negative except as mentioned in the HPI.  Physical Exam: Vital signs BP 130/88   Pulse 70   Temp 98.2 F (36.8 C) (Temporal)   Ht 5' 2 (1.575 m)   Wt 165 lb (74.8 kg)   LMP 01/21/2024 (Exact Date)   SpO2 99%   BMI 30.18 kg/m   General:   Alert,  Well-developed, pleasant and cooperative in NAD Lungs:  Clear throughout to auscultation.   Heart:  Regular rate and rhythm Abdomen:  Soft, nontender and nondistended.   Neuro/Psych:  Alert and cooperative. Normal mood and affect. A and O x 3  Marcey Naval, MD [facility]

## 2024-01-28 NOTE — Op Note (Signed)
 [facility] Patient Name: Kayla Solis Procedure Date: 01/28/2024 10:17 AM MRN: [id number] Endoscopist: Elspeth P. Leigh , MD, [id number] Age: 43 Referring MD:  Date of Birth: [birth year] Gender: Female Account #: [id number] Procedure:                Colonoscopy Indications:              Screening in patient at increased risk: Family                            history of 1st-degree relative with colorectal                            cancer (sister dx age 49s), sessile serrated polyp                            removed 02/2017 Medicines:                Monitored Anesthesia Care Procedure:                Pre-Anesthesia Assessment:                           - Prior to the procedure, a History and Physical                            was performed, and patient medications and                            allergies were reviewed. The patient's tolerance of                            previous anesthesia was also reviewed. The risks                            and benefits of the procedure and the sedation                            options and risks were discussed with the patient.                            All questions were answered, and informed consent                            was obtained. Prior Anticoagulants: The patient has                            taken no anticoagulant or antiplatelet agents. ASA                            Grade Assessment: II - A patient with mild systemic                            disease. After reviewing the risks and benefits,  the patient was deemed in satisfactory condition to                            undergo the procedure.                           After obtaining informed consent, the colonoscope                            was passed under direct vision. Throughout the                            procedure, the patient's blood pressure, pulse, and                            oxygen saturations were monitored  continuously. The                            Olympus Scope SN: [id number] was introduced through                            the anus and advanced to the the terminal ileum,                            with identification of the appendiceal orifice and                            IC valve. The colonoscopy was performed without                            difficulty. The patient tolerated the procedure                            well. The quality of the bowel preparation was                            good. The terminal ileum, ileocecal valve,                            appendiceal orifice, and rectum were photographed. Scope In: 10:21:11 AM Scope Out: 10:35:00 AM Scope Withdrawal Time: 0 hours 10 minutes 54 seconds  Total Procedure Duration: 0 hours 13 minutes 49 seconds  Findings:                 The perianal and digital rectal examinations were                            normal.                           The terminal ileum appeared normal.                           A 4 mm polyp was found in the transverse colon. The  polyp was flat. The polyp was removed with a cold                            snare. Resection was complete, but the polyp tissue                            was not retrieved (suctioned but not found in the                            trap or scope).                           Internal hemorrhoids were found during                            retroflexion. The hemorrhoids were small.                           Anal papilla(e) were hypertrophied.                           The exam was otherwise without abnormality. Complications:            No immediate complications. Estimated blood loss:                            Minimal. Estimated Blood Loss:     Estimated blood loss was minimal. Impression:               - The examined portion of the ileum was normal.                           - One 4 mm polyp in the transverse colon, removed                             with a cold snare. Complete resection. Polyp tissue                            not retrieved.                           - Internal hemorrhoids.                           - Anal papilla(e) were hypertrophied.                           - The examination was otherwise normal. Recommendation:           - Patient has a contact number available for                            emergencies. The signs and symptoms of potential                            delayed complications were discussed with the  patient. Return to normal activities tomorrow.                            Written discharge instructions were provided to the                            patient.                           - Resume previous diet.                           - Continue present medications.                           - Repeat colonoscopy in 5 years for surveillance                            given polyp removed and strong family history of                            colon cancer. Elspeth P. Varsha Knock, MD 01/28/2024 10:39:57 AM This report has been signed electronically.

## 2024-01-28 NOTE — Progress Notes (Signed)
 Report to PACU, RN, vss, BBS= Clear.

## 2024-01-29 ENCOUNTER — Telehealth: Payer: Self-pay

## 2024-01-29 NOTE — Telephone Encounter (Signed)
 Left message on answering machine.

## 2024-05-24 ENCOUNTER — Encounter: Payer: 59 | Admitting: Physician Assistant

## 2024-06-21 ENCOUNTER — Ambulatory Visit: Admitting: Dermatology
